# Patient Record
Sex: Female | Born: 1975 | Race: Black or African American | Hispanic: Yes | Marital: Married | Smoking: Current every day smoker
Health system: Southern US, Community
[De-identification: ages and names within clinical notes are randomized; demographics above are authoritative.]

## PROBLEM LIST (undated history)

## (undated) DIAGNOSIS — M1710 Unilateral primary osteoarthritis, unspecified knee: Secondary | ICD-10-CM

## (undated) DIAGNOSIS — Z8489 Family history of other specified conditions: Secondary | ICD-10-CM

## (undated) DIAGNOSIS — S0990XA Unspecified injury of head, initial encounter: Secondary | ICD-10-CM

## (undated) DIAGNOSIS — G4730 Sleep apnea, unspecified: Secondary | ICD-10-CM

## (undated) DIAGNOSIS — I10 Essential (primary) hypertension: Secondary | ICD-10-CM

## (undated) DIAGNOSIS — R011 Cardiac murmur, unspecified: Secondary | ICD-10-CM

## (undated) HISTORY — DX: Unilateral primary osteoarthritis, unspecified knee: M17.10

## (undated) HISTORY — DX: Essential (primary) hypertension: I10

## (undated) HISTORY — DX: Sleep apnea, unspecified: G47.30

## (undated) HISTORY — DX: Cardiac murmur, unspecified: R01.1

---
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
## 1994-10-17 HISTORY — PX: APPENDECTOMY: SHX54

## 2009-10-17 DIAGNOSIS — G4730 Sleep apnea, unspecified: Secondary | ICD-10-CM

## 2009-10-17 HISTORY — DX: Sleep apnea, unspecified: G47.30

## 2009-11-16 ENCOUNTER — Ambulatory Visit (HOSPITAL_BASED_OUTPATIENT_CLINIC_OR_DEPARTMENT_OTHER): Admission: RE | Admit: 2009-11-16 | Discharge: 2009-11-16 | Payer: Self-pay | Admitting: Neurology

## 2009-12-06 ENCOUNTER — Ambulatory Visit: Payer: Self-pay | Admitting: Pulmonary Disease

## 2011-04-08 ENCOUNTER — Ambulatory Visit (INDEPENDENT_AMBULATORY_CARE_PROVIDER_SITE_OTHER): Payer: Medicaid Other

## 2011-04-08 ENCOUNTER — Inpatient Hospital Stay (INDEPENDENT_AMBULATORY_CARE_PROVIDER_SITE_OTHER)
Admission: RE | Admit: 2011-04-08 | Discharge: 2011-04-08 | Disposition: A | Discharge status: Home / Self Care | Payer: Medicaid Other | Source: Ambulatory Visit | Attending: Emergency Medicine | Admitting: Emergency Medicine

## 2011-04-08 DIAGNOSIS — S60219A Contusion of unspecified wrist, initial encounter: Secondary | ICD-10-CM

## 2011-08-12 ENCOUNTER — Inpatient Hospital Stay (INDEPENDENT_AMBULATORY_CARE_PROVIDER_SITE_OTHER)
Admission: RE | Admit: 2011-08-12 | Discharge: 2011-08-12 | Disposition: A | Discharge status: Home / Self Care | Payer: Medicaid Other | Source: Ambulatory Visit | Attending: Family Medicine | Admitting: Family Medicine

## 2011-08-12 ENCOUNTER — Emergency Department (HOSPITAL_COMMUNITY)
Admission: EM | Admit: 2011-08-12 | Discharge: 2011-08-12 | Disposition: A | Discharge status: Home / Self Care | Payer: Medicaid Other | Attending: Emergency Medicine | Admitting: Emergency Medicine

## 2011-08-12 DIAGNOSIS — E669 Obesity, unspecified: Secondary | ICD-10-CM | POA: Insufficient documentation

## 2011-08-12 DIAGNOSIS — R252 Cramp and spasm: Secondary | ICD-10-CM | POA: Insufficient documentation

## 2011-08-12 DIAGNOSIS — M79609 Pain in unspecified limb: Secondary | ICD-10-CM

## 2011-08-12 DIAGNOSIS — A4902 Methicillin resistant Staphylococcus aureus infection, unspecified site: Secondary | ICD-10-CM | POA: Insufficient documentation

## 2011-08-12 DIAGNOSIS — L680 Hirsutism: Secondary | ICD-10-CM | POA: Insufficient documentation

## 2011-08-12 DIAGNOSIS — M7989 Other specified soft tissue disorders: Secondary | ICD-10-CM

## 2011-08-17 ENCOUNTER — Inpatient Hospital Stay (INDEPENDENT_AMBULATORY_CARE_PROVIDER_SITE_OTHER)
Admission: RE | Admit: 2011-08-17 | Discharge: 2011-08-17 | Disposition: A | Discharge status: Home / Self Care | Payer: Medicaid Other | Source: Ambulatory Visit | Attending: Family Medicine | Admitting: Family Medicine

## 2011-08-17 DIAGNOSIS — I10 Essential (primary) hypertension: Secondary | ICD-10-CM

## 2011-08-17 DIAGNOSIS — B957 Other staphylococcus as the cause of diseases classified elsewhere: Secondary | ICD-10-CM

## 2011-08-17 DIAGNOSIS — Z1611 Resistance to penicillins: Secondary | ICD-10-CM

## 2011-10-14 ENCOUNTER — Ambulatory Visit: Payer: Medicaid Other | Admitting: Emergency Medicine

## 2011-10-27 ENCOUNTER — Encounter: Payer: Self-pay | Admitting: Emergency Medicine

## 2011-10-27 ENCOUNTER — Ambulatory Visit (INDEPENDENT_AMBULATORY_CARE_PROVIDER_SITE_OTHER): Payer: Medicaid Other | Admitting: Emergency Medicine

## 2011-10-27 VITALS — BP 180/110 | HR 91 | Ht 62.0 in | Wt 348.0 lb

## 2011-10-27 DIAGNOSIS — J4550 Severe persistent asthma, uncomplicated: Secondary | ICD-10-CM | POA: Insufficient documentation

## 2011-10-27 DIAGNOSIS — G4733 Obstructive sleep apnea (adult) (pediatric): Secondary | ICD-10-CM | POA: Insufficient documentation

## 2011-10-27 DIAGNOSIS — G4730 Sleep apnea, unspecified: Secondary | ICD-10-CM

## 2011-10-27 DIAGNOSIS — F649 Gender identity disorder, unspecified: Secondary | ICD-10-CM | POA: Insufficient documentation

## 2011-10-27 DIAGNOSIS — J45909 Unspecified asthma, uncomplicated: Secondary | ICD-10-CM

## 2011-10-27 DIAGNOSIS — Z720 Tobacco use: Secondary | ICD-10-CM

## 2011-10-27 DIAGNOSIS — K625 Hemorrhage of anus and rectum: Secondary | ICD-10-CM | POA: Insufficient documentation

## 2011-10-27 DIAGNOSIS — N926 Irregular menstruation, unspecified: Secondary | ICD-10-CM | POA: Insufficient documentation

## 2011-10-27 DIAGNOSIS — F642 Gender identity disorder of childhood: Secondary | ICD-10-CM

## 2011-10-27 DIAGNOSIS — I10 Essential (primary) hypertension: Secondary | ICD-10-CM | POA: Insufficient documentation

## 2011-10-27 DIAGNOSIS — Z23 Encounter for immunization: Secondary | ICD-10-CM

## 2011-10-27 DIAGNOSIS — F17200 Nicotine dependence, unspecified, uncomplicated: Secondary | ICD-10-CM

## 2011-10-27 LAB — CBC
Hemoglobin: 13.5 g/dL (ref 12.0–15.0)
MCH: 28.1 pg (ref 26.0–34.0)
MCV: 85.8 fL (ref 78.0–100.0)
RBC: 4.8 MIL/uL (ref 3.87–5.11)

## 2011-10-27 LAB — COMPREHENSIVE METABOLIC PANEL
CO2: 27 mEq/L (ref 19–32)
Calcium: 9.6 mg/dL (ref 8.4–10.5)
Glucose, Bld: 80 mg/dL (ref 70–99)
Sodium: 140 mEq/L (ref 135–145)
Total Bilirubin: 0.4 mg/dL (ref 0.3–1.2)
Total Protein: 7.6 g/dL (ref 6.0–8.3)

## 2011-10-27 LAB — TSH: TSH: 2.959 u[IU]/mL (ref 0.350–4.500)

## 2011-10-27 MED ORDER — ALBUTEROL SULFATE (2.5 MG/3ML) 0.083% IN NEBU: 2.5000 mg | INHALATION_SOLUTION | Freq: Four times a day (QID) | RESPIRATORY_TRACT | Status: DC | PRN

## 2011-10-27 MED ORDER — ALBUTEROL SULFATE HFA 108 (90 BASE) MCG/ACT IN AERS: 2.0000 | INHALATION_SPRAY | Freq: Four times a day (QID) | RESPIRATORY_TRACT | Status: DC | PRN

## 2011-10-27 MED ORDER — LISINOPRIL-HYDROCHLOROTHIAZIDE 10-12.5 MG PO TABS: 1.0000 | ORAL_TABLET | Freq: Every day | ORAL | Status: DC

## 2011-10-27 NOTE — Progress Notes (Signed)
  Subjective:    Patient ID: Rachael Rogers, female    DOB: April 28, 1976, 36 y.o.   MRN: [id number]  HPI Rachael Rogers is here today to establish care.  She has multiple concerns, including headache, asthma, smoking cessation, alcohol use, possible PCOS, gender identity, rectal bleeding, and sleep apnea.  She was also noted to have a blood pressure of 180/110.  After discussion with patient, we agreed to address high blood pressure, possible PCOS and asthma medications today.  She will return in a few weeks to discuss other concerns.  1. High blood pressure: Patient reports that it was also elevated in October when she was seen in Urgent Care for a MRSA skin infection.  Does complain of headache behind the eyes that responds poorly to OTC medications.  Denies leg edema, chest pain, palpitations, new/different dyspnea.  Has never been treated in the past.  2. Irregular Menses: Menarche at age 52; irregular since then (though they were more regular when she was down to 300lbs).  Has had facial hair since age 38 and started gaining weight around age 2 as well.  No history of STDs or vaginal discharge.  Has never had a pap smear.  Biggest concern is that having PCOS will interfere with started testosterone in the future for gender identity.  3. Asthma: Patient states on disability for her asthma.  Takes nebulized albuterol 3x/day as well as an albuterol inhaler.  I have reviewed and updated the following as appropriate: allergies, current medications, past family history, past medical history, past social history, past surgical history and problem list   Review of Systems See HPI.    Objective:   Physical Exam Vitals: reviewed, notable for BP of 180/110 General: obese, pleasant, cooperative HEENT: AT/[place], facial hair on chin and upper lip, MMM, sclera white, EOMI CV: distant heart sounds due to body habitus; RRR, no murmurs Pulm: CTAB, no wheezes, rales Abd: obese, soft, non-tender Ext: no peripheral  edema Skin: no rashes Neuro: gait normal      Assessment & Plan:

## 2011-10-27 NOTE — Assessment & Plan Note (Signed)
Likely secondary to PCOS given facial hair, irregular periods and obesity.  Will do a pap smear at a future visit and discuss a pelvic ultrasound then as well.

## 2011-10-27 NOTE — Assessment & Plan Note (Signed)
Pt reports severe asthma for which she has disability.  Will refill her albuterol inhaler and albuterol nebs.  Will need to discuss further as she does not report being on an inhaled steroid.

## 2011-10-27 NOTE — Patient Instructions (Addendum)
It was nice to meet you!  We have a lot of things to work on.  We are going to get some labs this afternoon - I will call you if something is wrong.  Your blood pressure is very high.  I have sent a prescription for a combination medication to the pharmacy.  Please take this once a day!!  I have sent asthma medicine to your pharmacy ([facility] on [address])  You are going to bring your sleep study results with you to the next appointment so we can start the process of getting you CPAP.  I will see you back in 2-3 weeks to follow up blood pressure and address some of your other concerns.

## 2011-10-27 NOTE — Assessment & Plan Note (Signed)
Patient to bring results of her sleep study to next appointment so we can refer for CPAP.

## 2011-10-27 NOTE — Assessment & Plan Note (Signed)
Blood pressure significantly elevated today.  Pt reports elevated blood pressures in October as well.  Likely multifactorial with history of sleep apnea (not on CPAP) and obesity.  Will check Cmet, CBC, TSH and start HCTZ/lisinopril 12.5/10 daily.  Will call patient and change medication if abnormalities appear on labs.  Will follow up in 2-3 weeks.

## 2011-10-27 NOTE — Progress Notes (Signed)
Addended by: Phebe Colla on: 10/27/2011 05:37 PM   Modules accepted: Orders

## 2011-10-31 ENCOUNTER — Encounter: Payer: Self-pay | Admitting: Emergency Medicine

## 2011-11-17 ENCOUNTER — Ambulatory Visit: Payer: Medicaid Other | Admitting: Emergency Medicine

## 2011-12-16 ENCOUNTER — Ambulatory Visit: Payer: Medicaid Other | Admitting: Emergency Medicine

## 2011-12-27 ENCOUNTER — Ambulatory Visit (INDEPENDENT_AMBULATORY_CARE_PROVIDER_SITE_OTHER): Payer: Medicaid Other | Admitting: Emergency Medicine

## 2011-12-27 ENCOUNTER — Encounter: Payer: Self-pay | Admitting: Emergency Medicine

## 2011-12-27 VITALS — BP 150/95 | HR 107 | Ht 62.0 in | Wt 344.0 lb

## 2011-12-27 DIAGNOSIS — Z720 Tobacco use: Secondary | ICD-10-CM

## 2011-12-27 DIAGNOSIS — Z79899 Other long term (current) drug therapy: Secondary | ICD-10-CM | POA: Insufficient documentation

## 2011-12-27 DIAGNOSIS — J45909 Unspecified asthma, uncomplicated: Secondary | ICD-10-CM

## 2011-12-27 DIAGNOSIS — K0889 Other specified disorders of teeth and supporting structures: Secondary | ICD-10-CM | POA: Insufficient documentation

## 2011-12-27 DIAGNOSIS — I10 Essential (primary) hypertension: Secondary | ICD-10-CM

## 2011-12-27 DIAGNOSIS — K089 Disorder of teeth and supporting structures, unspecified: Secondary | ICD-10-CM

## 2011-12-27 DIAGNOSIS — F17200 Nicotine dependence, unspecified, uncomplicated: Secondary | ICD-10-CM

## 2011-12-27 DIAGNOSIS — G4733 Obstructive sleep apnea (adult) (pediatric): Secondary | ICD-10-CM

## 2011-12-27 DIAGNOSIS — Z5189 Encounter for other specified aftercare: Secondary | ICD-10-CM

## 2011-12-27 MED ORDER — KETOROLAC TROMETHAMINE 60 MG/2ML IM SOLN
60.0000 mg | Freq: Once | INTRAMUSCULAR | Status: AC
  Administered 2011-12-27: 60 mg via INTRAMUSCULAR

## 2011-12-27 MED ORDER — OXYCODONE-ACETAMINOPHEN 5-325 MG PO TABS: 1.0000 | ORAL_TABLET | ORAL | Status: AC | PRN

## 2011-12-27 MED ORDER — PENICILLIN V POTASSIUM 500 MG PO TABS: 500.0000 mg | ORAL_TABLET | Freq: Four times a day (QID) | ORAL | Status: AC

## 2011-12-27 MED ORDER — BECLOMETHASONE DIPROPIONATE 80 MCG/ACT IN AERS: 2.0000 | INHALATION_SPRAY | Freq: Two times a day (BID) | RESPIRATORY_TRACT | Status: AC

## 2011-12-27 NOTE — Assessment & Plan Note (Signed)
Concern for infection on exam.  Will give PCN VK x10 days and Percocet 5-325 q4hr prn. Patient to continue ibuprofen as well. Patient provided with list of Medicaid dentists and is to see them for definitive treatment.  Will give toradol 60mg  IM in clinic for pain control.

## 2011-12-27 NOTE — Patient Instructions (Addendum)
For your tooth - I have given you a prescription for penicillin and Percocet.  The percocet may make you feel a little loopy, so take it when you have some time at home the first time.  Please continue taking ibuprofen to help with inflammation.  For your blood pressure - keep taking you medicine.  Please check your blood pressure at the drug store after you have been taking the medication for 1-2 weeks.  I have also put in an order for a sleep study for your sleep apnea.  For your asthma - please start taking QVAR 2 puffs twice a day.  Also, please schedule an appointment with Dr. Raymondo Band for spirometry and smoking cessation (you should be able to do this at the front desk on the way out).   Please see me back in 1 month to continue working on your blood pressure and other concerns.

## 2011-12-27 NOTE — Assessment & Plan Note (Signed)
Patient is down to 12 cig/day.  Will refer to Dr. Raymondo Band for further smoking cessation counseling.

## 2011-12-27 NOTE — Assessment & Plan Note (Signed)
Patient with difficulty obtaining medications, both due to cost and transportation issues.

## 2011-12-27 NOTE — Assessment & Plan Note (Signed)
Not controlled, but better.  Patient with difficulty pay for and obtaining medication.  Has missed several doses this week.  Will continue current medication and have her check BP at pharmacy after taking medicine consistently for 1-2 weeks and call with result.  Will also order split sleep study for sleep apnea.

## 2011-12-27 NOTE — Progress Notes (Signed)
  Subjective:    Patient ID: Rachael Rogers, female    DOB: [birth year], 36 y.o.   MRN: [id number]  HPI Ms. Hilgeman is here today for several issues.  1. Toothache: This has been going on for 3 days and is making it difficult for her to sleep.  Located in left lower jaw.  No fevers or facial swelling.  Associated with headache.  Headache relieved by excedrin, but no help with tooth pain.  Has also tried motrin 800mg , salt rinse, listerine rinse, and peroxide rinse with no relief.  2. HTN: Patient had some difficulty getting the medication.  Did get the medicine several weeks ago, but missed several doses this week.  Tolerating the medicine well.  No vision changes, headaches, chest pain, worsening of shortness of breath, dizziness.  Does has some leg edema.  3. Asthma: Diagnosed as a young child as severe persistent.  Currently taking albuterol (MDI and nebs) only, 4-6 times per day.  Has a daily nighttime cough.  Does have episodes of audible wheezing every few weeks, that she deals with at home.  Has been on inhaled steroids before, but these were stopped due to weight gain.  Is a current smoker - working on quitting, down to 12 cigs/day from 1.5ppd.   Review of Systems See HPI, otherwise negative    Objective:   Physical Exam BP 150/95  Pulse 107  Ht 5\' 2"  (1.575 m)  Wt 344 lb (156.037 kg)  BMI 62.92 kg/m2 Gen: morbid obesity, NAD, cooperative HEENT: AT/[place], sclera white, no pharyngeal erythema or exudate, multiple fillings, pain with palpation of all lower left molars, white plaque with mild surrounding erythema on left lower posterior gum that is exquisitely tender, no facial swelling Neck: no LAD CV: RRR, no murmurs or gallops Pulm: CTAB, no wheezes or rales Abd: obese Ext: 2+ pitting edema to just below knees bilaterally; WWP Skin: normal Neuro: no obvious focal deficits; gait normal      Assessment & Plan:

## 2011-12-27 NOTE — Assessment & Plan Note (Signed)
Based on history is likely severe persistent.  Has been on inhaled steroids in the past, but stopped them due to weight gain.  Will start QVAR 2 puffs BID.  Will also obtain spirometry with and without bronchodilator to evaluate for any non-reversible component.

## 2012-01-23 ENCOUNTER — Ambulatory Visit (HOSPITAL_BASED_OUTPATIENT_CLINIC_OR_DEPARTMENT_OTHER): Payer: Medicaid Other

## 2012-01-23 ENCOUNTER — Ambulatory Visit: Payer: Medicaid Other | Admitting: Pharmacist

## 2012-01-25 ENCOUNTER — Ambulatory Visit: Payer: Medicaid Other | Admitting: Emergency Medicine

## 2012-02-07 ENCOUNTER — Ambulatory Visit (INDEPENDENT_AMBULATORY_CARE_PROVIDER_SITE_OTHER): Payer: Medicaid Other | Admitting: Emergency Medicine

## 2012-02-07 ENCOUNTER — Encounter: Payer: Self-pay | Admitting: Pharmacist

## 2012-02-07 ENCOUNTER — Ambulatory Visit (INDEPENDENT_AMBULATORY_CARE_PROVIDER_SITE_OTHER): Payer: Medicaid Other | Admitting: Pharmacist

## 2012-02-07 ENCOUNTER — Encounter: Payer: Self-pay | Admitting: Emergency Medicine

## 2012-02-07 VITALS — BP 140/99 | HR 100 | Ht 59.0 in | Wt 343.0 lb

## 2012-02-07 DIAGNOSIS — Z720 Tobacco use: Secondary | ICD-10-CM

## 2012-02-07 DIAGNOSIS — R51 Headache: Secondary | ICD-10-CM

## 2012-02-07 DIAGNOSIS — J4550 Severe persistent asthma, uncomplicated: Secondary | ICD-10-CM

## 2012-02-07 DIAGNOSIS — F17200 Nicotine dependence, unspecified, uncomplicated: Secondary | ICD-10-CM

## 2012-02-07 DIAGNOSIS — G43909 Migraine, unspecified, not intractable, without status migrainosus: Secondary | ICD-10-CM | POA: Insufficient documentation

## 2012-02-07 DIAGNOSIS — I10 Essential (primary) hypertension: Secondary | ICD-10-CM

## 2012-02-07 DIAGNOSIS — G4730 Sleep apnea, unspecified: Secondary | ICD-10-CM

## 2012-02-07 DIAGNOSIS — J45909 Unspecified asthma, uncomplicated: Secondary | ICD-10-CM

## 2012-02-07 MED ORDER — LISINOPRIL-HYDROCHLOROTHIAZIDE 10-12.5 MG PO TABS: 1.0000 | ORAL_TABLET | Freq: Every day | ORAL | Status: DC

## 2012-02-07 NOTE — Assessment & Plan Note (Signed)
Received sleep study from 2011 showing moderate sleep apnea.  Patient would prefer to get repeat sleep study and CPAP through neurology (seen Dr. Jacques Navy in past).  If she is unable to get an appointment with neurology, will do sleep study through [facility].

## 2012-02-07 NOTE — Assessment & Plan Note (Addendum)
Likely migraine without aura.  Discussed starting propranolol to decrease frequency and severity.  Discussed common side effects.  Patient would like to hold off for now until she sees the neurologist. Will not give a triptan at this time as I am concerned she will take it daily and increase risk of rebound headaches.

## 2012-02-07 NOTE — Patient Instructions (Signed)
It was nice to see you again! I'm glad you saw Dr. Raymondo Band and it looks like you have a good plan to quit smoking! For your headaches (they sound like migraines), I would consider starting Propranolol to decrease the frequency and severity of the headaches.  The most common side effects of this medicine are lower blood pressure and feeling fatigue. Let me know if there is anything I can do to help you re-establish with your neurologist, Dr. Jacques Navy.   I will see you back in 1-2 months to do a pap smear.

## 2012-02-07 NOTE — Assessment & Plan Note (Signed)
Patient is a 81 YOF with mod-severe nicotine dependence of 25 years duration, morbid obesity, OSA, asthma, and HTN. Patient has multiple risk factors for CAD. Modifiable risk factors include smoking, obesity, and HTN.   1. Smoking - Patient is a fair candidate for success b/c of previously successful quit periods "cold [place]." Quit date will be Mother's Day. Until quit date, patient will aim to cut down to 5 cigarettes per day. If patient quits for 1 week, she and her family will going out to watch a movie as a reward. F/u tobacco cessation counseling appt on 03/13/12 after Mother's Day.  2. Spirometry Test Results/Asthma/OSA - Near normal spirometry findings, though her lung age is 36yo and patient's results confounded by use of albuterol today. Patient's goals to lose weight and to begin using QVAR again may help significantly with her asthma and OSA symptoms. QVAR Rx refilled.  F/U Rx Clinic Visit 03/13/12.  Total time in face-to-face counseling 50 minutes.  Patient seen with Tana Conch, PharmD, Pharmacy Resident.

## 2012-02-07 NOTE — Patient Instructions (Signed)
Congratulations on your commitment to quitting smoking! 1. From now until your quit date on Mother's Day, our plan is for you to cut down to 5 cigarettes per day. If you quit for 1 week, your family gets a reward of going out to watch a movie! 2. Purchase QVAR. 3. Goal weight loss: 1-2 lbs per week. Will cut back on beer and soda.

## 2012-02-07 NOTE — Assessment & Plan Note (Signed)
Much better on the lisinopril/HCTZ.  Continue current medications.

## 2012-02-07 NOTE — Assessment & Plan Note (Signed)
Seen by Dr. Raymondo Band today, appreciate his assistance in creating a quit plan.

## 2012-02-07 NOTE — Assessment & Plan Note (Signed)
Patient is a 79 YOF with mod-severe nicotine dependence of 25 years duration, morbid obesity, OSA, asthma, and HTN. Patient has multiple risk factors for CAD. Modifiable risk factors include smoking, obesity, and HTN.   1. Smoking - Patient is a fair candidate for success b/c of previously successful quit periods "cold [place]." Quit date will be Mother's Day. Until quit date, patient will aim to cut down to 5 cigarettes per day. If patient quits for 1 week, she and her family will going out to watch a movie as a reward. F/u tobacco cessation counseling appt on 03/13/12 after Mother's Day.  F/U Rx Clinic Visit 03/13/12.  Total time in face-to-face counseling 50 minutes.  Patient seen with Tana Conch, PharmD, Pharmacy Resident.

## 2012-02-07 NOTE — Progress Notes (Signed)
  Subjective:    Patient ID: Rachael Rogers, female    DOB: 09-24-1976, 36 y.o.   MRN: [id number]  HPI Rachael Rogers is here for follow up of blood pressure and headaches.  Hypertension Well controlled: yes Compliant with medication: yes Side effects from medication: no Check BP at home: no  Chest pain: no Palpitations: no Leg edema: no Dizziness: no  Headaches: Reports daily headaches. Typically located frontally and in the eyes.  Throbbing. + photophobia, phonophobia, nausea.  Lasts hours to days.  Motrin does not really help much.  The percocet for her tooth from last visit did not help with the headaches.  Does have a headache today, but located in neck and she attributes to her pillow.  Has seen Dr. Jacques Navy in Neurology in the past and is trying to get an appointment with her again.  Was told she had scarring in her brain on MRI.  I have reviewed and updated the following as appropriate: allergies and current medications SHx: working with Dr. Raymondo Band to quit smoking  Review of Systems See HPI    Objective:   Physical Exam BP 140/99  Pulse 100  Ht 4\' 11"  (1.499 m)  Wt 343 lb (155.584 kg)  BMI 69.28 kg/m2  LMP 11/09/2011 Gen: morbidly obese, alert, NAD HEENT: AT/[place], sclera white, PERRL, no obvious papilledema on fundoscopic exam.  Mild tenderness to palpation over left maxillary sinus. Neck: supple, no CAD, knot in left superior trapezius  CV: RRR, no murmurs Pulm: CTAB, no wheezes     Assessment & Plan:

## 2012-02-07 NOTE — Progress Notes (Signed)
  Subjective:    Patient ID: Rachael Rogers, female    DOB: [birth year], 36 y.o.   MRN: [id number]  HPI Age when started using tobacco on a daily basis 36 yo. Number of Cigarettes per day 10-20. Brand smoked Maverick Menthols. Estimated Nicotine Content per Cigarette (mg) 1mg .  Estimated Nicotine intake per day 10-20 mg.   Smokes first cigarette immediately after waking. Denies waking to smoke / Smokes times per night.    Estimated Fagerstrom Score >5/10.  Has been able to quit cold [place] for >6 months multiple times.  Has not used Medications (NRT, bupropion, varenicline) in past.  Rates IMPORTANCE of quitting tobacco on 1-10 scale of 10. Rates READINESS of quitting tobacco on 1-10 scale of 15. Rates CONFIDENCE of quitting tobacco on 1-10 scale of 10. Triggers to use tobacco include; waking up, emotional stress (caring for kids and puppy), tragedy (death of friend)  Review of Systems     Objective:   Physical Exam  See Documentation Flowsheet (discrete results - PFTs) for complete Spirometry results. Patient provided good effort while attempting spirometry.   Lung Age = 36 yo     Assessment & Plan:  Patient is a 36 YOF with mod-severe nicotine dependence of 25 years duration, morbid obesity, OSA, asthma, and HTN. Patient has multiple risk factors for CAD. Modifiable risk factors include smoking, obesity, and HTN.   1. Smoking - Patient is a fair candidate for success b/c of previously successful quit periods "cold [place]." Quit date will be Mother's Day. Until quit date, patient will aim to cut down to 5 cigarettes per day. If patient quits for 1 week, she and her family will going out to watch a movie as a reward. F/u tobacco cessation counseling appt on 03/13/12 after Mother's Day.  2. Obesity/HTN - Goal weight loss 1-2 lbs per week. Discussed cutting back on beer and soda. Discussed exercise plans. Will need to discuss salt intake at next visit.  3. Spirometry Test  Results/Asthma/OSA - Near normal spirometry findings, though her lung age is 36yo and patient's results confounded by use of albuterol today. Patient's goals to lose weight and to begin using QVAR again may help significantly with her asthma and OSA symptoms. QVAR Rx refilled.  F/U Rx Clinic Visit 03/13/12.  Total time in face-to-face counseling 50 minutes.  Patient seen with Tana Conch, PharmD, Pharmacy Resident.

## 2012-02-08 NOTE — Progress Notes (Signed)
  Subjective:    Patient ID: Rachael Rogers, female    DOB: Apr 06, 1976, 36 y.o.   MRN: [id number]  HPI Reviewed and agree with Dr. Macky Lower management.      Review of Systems     Objective:   Physical Exam        Assessment & Plan:

## 2012-02-12 ENCOUNTER — Ambulatory Visit (HOSPITAL_BASED_OUTPATIENT_CLINIC_OR_DEPARTMENT_OTHER): Payer: Medicaid Other

## 2012-03-13 ENCOUNTER — Ambulatory Visit: Payer: Medicaid Other | Admitting: Pharmacist

## 2012-03-20 ENCOUNTER — Ambulatory Visit (INDEPENDENT_AMBULATORY_CARE_PROVIDER_SITE_OTHER): Payer: Medicaid Other | Admitting: Pharmacist

## 2012-03-20 ENCOUNTER — Encounter: Payer: Self-pay | Admitting: Pharmacist

## 2012-03-20 VITALS — BP 138/102 | HR 60 | Wt 343.0 lb

## 2012-03-20 DIAGNOSIS — Z720 Tobacco use: Secondary | ICD-10-CM

## 2012-03-20 DIAGNOSIS — F17200 Nicotine dependence, unspecified, uncomplicated: Secondary | ICD-10-CM

## 2012-03-20 NOTE — Patient Instructions (Signed)
Cut down to 3 cigarettes a day in the next week  Try to quit around travel back to [place] in July Return to pharmacy clinic in early August

## 2012-03-20 NOTE — Assessment & Plan Note (Signed)
moderate Nicotine Dependence of several years duration in a patient who is good candidate for success b/c of current success with cutting down from 21 to 7 cigs per day and current level of motivation to be a good role model for her child AND desire to save money. She plans to cut down to 3 cigs per day within the next week.  Quit date will be before her vacation to [place] in later June or early July.  Next visit will be with Dr. Elwyn Reach in the next few weeks. F/U Rx Clinic Visit in August.   Total time in face-to-face counseling 20 minutes.  Patient seen with Brett Fairy, PharmD, Pharmacy Resident

## 2012-03-20 NOTE — Progress Notes (Signed)
Patient ID: Rachael Rogers, female   DOB: [birth year], 36 y.o.   MRN: [id number] Reviewed and agree with Dr. Macky Lower management.

## 2012-03-20 NOTE — Progress Notes (Signed)
  Subjective:    Patient ID: Rachael Rogers, female    DOB: Aug 12, 1976, 36 y.o.   MRN: [id number]  HPI  Patient arrives in good spirits and reports improved breathing.  She has decreased her albuterol use to once or twice daily.  She reports taking her QVAR and also reports decreasing # cigs to 7 per day.  She is willing to consider a quit date by the end of the month.   She is anticipating a visit to [facility] for the month of July and states she needs to be quit by that vacation.   She has multiple strategies to keep herself busy and currently states her main triggers for smoking are after meals AND first thing in the morning while using the bathroom.     Review of Systems     Objective:   Physical Exam        Assessment & Plan:  moderate Nicotine Dependence of several years duration in a patient who is good candidate for success b/c of current success with cutting down from 21 to 7 cigs per day and current level of motivation to be a good role model for her child AND desire to save money. She plans to cut down to 3 cigs per day within the next week.  Quit date will be before her vacation to [place] in later June or early July.  Next visit will be with Dr. Elwyn Reach in the next few weeks. F/U Rx Clinic Visit in August.   Total time in face-to-face counseling 20 minutes.  Patient seen with Brett Fairy, PharmD, Pharmacy Resident. Marland Kitchen

## 2012-11-28 ENCOUNTER — Encounter: Payer: Medicaid Other | Admitting: Emergency Medicine

## 2012-12-17 ENCOUNTER — Encounter: Payer: Medicaid Other | Admitting: Emergency Medicine

## 2012-12-27 ENCOUNTER — Encounter: Payer: Medicaid Other | Admitting: Emergency Medicine

## 2013-01-01 ENCOUNTER — Telehealth: Payer: Self-pay | Admitting: *Deleted

## 2013-01-01 NOTE — Telephone Encounter (Signed)
Patient calls today to reschedule her sleep study appointment, her ride is not able to bring her due to inclement weather.  She would like to wait until the weather clears and then call us to reschedule her appointment. -S. Lizann Edelman, RPSGT

## 2013-04-24 ENCOUNTER — Ambulatory Visit (INDEPENDENT_AMBULATORY_CARE_PROVIDER_SITE_OTHER): Payer: Medicaid Other | Admitting: Emergency Medicine

## 2013-04-24 ENCOUNTER — Encounter: Payer: Self-pay | Admitting: Emergency Medicine

## 2013-04-24 VITALS — BP 162/99 | HR 84 | Temp 97.9°F | Resp 16 | Ht 62.0 in | Wt 342.5 lb

## 2013-04-24 DIAGNOSIS — Z720 Tobacco use: Secondary | ICD-10-CM

## 2013-04-24 DIAGNOSIS — I10 Essential (primary) hypertension: Secondary | ICD-10-CM

## 2013-04-24 DIAGNOSIS — R229 Localized swelling, mass and lump, unspecified: Secondary | ICD-10-CM

## 2013-04-24 DIAGNOSIS — J4550 Severe persistent asthma, uncomplicated: Secondary | ICD-10-CM

## 2013-04-24 DIAGNOSIS — F649 Gender identity disorder, unspecified: Secondary | ICD-10-CM

## 2013-04-24 DIAGNOSIS — J45909 Unspecified asthma, uncomplicated: Secondary | ICD-10-CM

## 2013-04-24 DIAGNOSIS — M25561 Pain in right knee: Secondary | ICD-10-CM | POA: Insufficient documentation

## 2013-04-24 DIAGNOSIS — F17200 Nicotine dependence, unspecified, uncomplicated: Secondary | ICD-10-CM

## 2013-04-24 DIAGNOSIS — M67432 Ganglion, left wrist: Secondary | ICD-10-CM | POA: Insufficient documentation

## 2013-04-24 DIAGNOSIS — F642 Gender identity disorder of childhood: Secondary | ICD-10-CM

## 2013-04-24 DIAGNOSIS — M25569 Pain in unspecified knee: Secondary | ICD-10-CM

## 2013-04-24 LAB — LIPID PANEL
Cholesterol: 182 mg/dL (ref 0–200)
HDL: 38 mg/dL — ABNORMAL LOW (ref >39)
LDL Cholesterol: 125 mg/dL — ABNORMAL HIGH (ref 0–99)
Triglycerides: 93 mg/dL (ref <150)

## 2013-04-24 LAB — BASIC METABOLIC PANEL
CO2: 30 mEq/L (ref 19–32)
Calcium: 9.3 mg/dL (ref 8.4–10.5)
Sodium: 139 mEq/L (ref 135–145)

## 2013-04-24 LAB — POCT GLYCOSYLATED HEMOGLOBIN (HGB A1C): Hemoglobin A1C: 5.6

## 2013-04-24 MED ORDER — ALBUTEROL SULFATE (2.5 MG/3ML) 0.083% IN NEBU: 2.5000 mg | INHALATION_SOLUTION | Freq: Four times a day (QID) | RESPIRATORY_TRACT | Status: DC | PRN

## 2013-04-24 MED ORDER — ALBUTEROL SULFATE HFA 108 (90 BASE) MCG/ACT IN AERS: 2.0000 | INHALATION_SPRAY | Freq: Four times a day (QID) | RESPIRATORY_TRACT | Status: DC | PRN

## 2013-04-24 MED ORDER — LISINOPRIL-HYDROCHLOROTHIAZIDE 10-12.5 MG PO TABS: 1.0000 | ORAL_TABLET | Freq: Every day | ORAL | Status: DC

## 2013-04-24 MED ORDER — MELOXICAM 15 MG PO TABS: 15.0000 mg | ORAL_TABLET | Freq: Every day | ORAL | Status: DC

## 2013-04-24 NOTE — Assessment & Plan Note (Signed)
Interested in quitting.  Does not want to try Chantix. Working with Dr. Raymondo Band has helped in the past. Encouraged her to see Dr. Raymondo Band again.

## 2013-04-24 NOTE — Assessment & Plan Note (Signed)
Elevated today, but has not taken any medication for almost 1 year. Restart lisinopril-HCTZ combo. Check BMP, A1c, and lipids today. Follow up in 2 weeks for recheck.

## 2013-04-24 NOTE — Assessment & Plan Note (Signed)
Located on left dorsal wrist.  Does not feel cystic.  Does not move with wrist or finger movement. Will refer to Sports Medicine for ultrasound of the lesion.

## 2013-04-24 NOTE — Patient Instructions (Addendum)
It was nice to see you! Please restart the blood pressure medication. For the wrist - you should get a call from Sports Medicine to schedule an appointment. For the knees - please get the x-rays - you can go to radiology at [facility] at any time. - take extra strength tylenol 2 tablets 3 times a day. - take meloxicam 1 tablet daily - DO NOT take ibuprofen, aleve or advil with this medicine. - ice your knees for 10 minutes 3 times a day, especially after activity Please make an appointment with Dr. Raymondo Band for smoking cessation. Follow up in 2 weeks.

## 2013-04-24 NOTE — Assessment & Plan Note (Signed)
I suspect OA given her weight and exam. Will check standing films. Scheduled tylenol 1g TID. Meloxicam 15mg  daily. Recommended icing the knees. Follow up in 2 weeks.  Consider steroid injection at that time.

## 2013-04-24 NOTE — Progress Notes (Signed)
Patient states here for bilateral knee pain Pain to her left wrist with a small "knot" to the top of the wrist

## 2013-04-24 NOTE — Assessment & Plan Note (Signed)
Will need to address starting a controller medication at some point.  For right now, I will refill albuterol.

## 2013-04-24 NOTE — Progress Notes (Signed)
  Subjective:    Patient ID: Rachael Rogers, female    DOB: Jul 13, 1976, 37 y.o.   MRN: [id number]  HPI CATHERENE KALETA is here for knee pain and annual exam.  I have reviewed and updated the following as appropriate: allergies, current medications and problem list SHx: current smoker - interested in quitting -HTN: has been out of medication for almost 1 year -Asthma: uses albuterol only; needs prescription for nebulizer machine  Bilateral knee pain Started with the right knee after the dog ran into it about 4-5 months ago and spread to the left knee.  She has noted some swelling, mostly in the right knee.  Pain is all over, but mostly anterior at inferior patella and lateral.  The pain has been getting progressively worse.  Pain is worse with walking, but also present at rest. No locking or clicking.  Has a family history of "arthritis."  Wrist nodule She reports having a wrist nodule for the last year or so.  This popped up after she injured her wrist moving some heavy furniture.  It doesn't really cause much pain unless she presses on it.  Denies any weakness in the wrist or hand.  Does sometimes get some tingling on the ring and pinkie fingers.  Gender Identity disorder Would like referral to a doctor for testosterone injections.  Review of Systems Patient Information Form: Screening and ROS  Do you feel safe in relationships? yes PHQ-2:negative  Review of Symptoms  General:  Negative for nexplained weight loss, fever Skin: Negative for new or changing mole, sore that won't heal HEENT: Negative for trouble hearing, trouble seeing, ringing in ears, mouth sores, hoarseness, change in voice, dysphagia. CV:  Negative for chest pain, dyspnea, edema, palpitations Resp: Negative for cough, dyspnea, hemoptysis GI: Negative for nausea, vomiting, +diarrhea, constipation, abdominal pain, melena, hematochezia. GU: Negative for dysuria, incontinence, urinary hesitance, hematuria, vaginal or  penile discharge, polyuria, sexual difficulty, lumps in testicle or breasts MSK: Negative for muscle cramps or aches, + joint pain or swelling Neuro: Negative for headaches, weakness, numbness, dizziness, passing out/fainting Psych: Negative for depression, anxiety, memory problems, + stress      Objective:   Physical Exam BP 162/99  Pulse 84  Temp(Src) 97.9 F (36.6 C)  Resp 16  Ht 5\' 2"  (1.575 m)  Wt 342 lb 8 oz (155.357 kg)  BMI 62.63 kg/m2  SpO2 95% Gen: alert, cooperative, NAD, morbidly obese HEENT: AT/[place], sclera white, MMM, facial hair present Neck: supple CV: RRR, no murmurs Pulm: CTAB, no wheezes or rales Right knee: moderate effusion, no erythema or warmth, tender to palpation along superior patellar tendon, lateral and medial joint lines, full AROM Left knee: minimal effusion, no erythema or warmth, tender to palpation along superior patellar tendon, lateral and medial joint lines, full AROM Left wrist: 3mm hard mobile nodule on on dorsal wrist just radial to ulnar styloid; mildly tender to palpation; palpation reproduces some numbness in ulnar hand      Assessment & Plan:

## 2013-04-24 NOTE — Assessment & Plan Note (Signed)
Would like referral to doctor for testosterone shots. She will contact me with the doctors information.

## 2013-04-26 ENCOUNTER — Encounter: Payer: Self-pay | Admitting: Emergency Medicine

## 2013-04-29 ENCOUNTER — Telehealth: Payer: Self-pay | Admitting: Emergency Medicine

## 2013-04-29 DIAGNOSIS — F649 Gender identity disorder, unspecified: Secondary | ICD-10-CM

## 2013-04-29 NOTE — Telephone Encounter (Signed)
Pt is requesting referral Dr. Ruby Cola  In [place] who is an Actor. JW

## 2013-04-30 NOTE — Telephone Encounter (Signed)
Discussed this with the patient at her last appointment.  Will place referral to Dr. 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

## 2013-04-30 NOTE — Telephone Encounter (Signed)
I will call the patient tomorrow to discuss the referral further.  I have canceled the referral for now.

## 2013-04-30 NOTE — Telephone Encounter (Signed)
Per Dr. Mariella Saa office, patient must be followed by a therapist that can provide them with a letter stating she is ready for transition.  Also this is something that will not be covered by Medicaid.   Rachael Rogers

## 2013-05-01 NOTE — Telephone Encounter (Signed)
I called and spoke with the patient regarding this referral.  Informed her that Medicaid would not cover hormone therapy.  I encouraged her to call Dr. Mariella Saa office to find out the cost of treatment.   She is currently seeing a therapist for gender transition.  Her therapist just changed, but she is meeting the new therapist in the next few weeks.  She will have the therapist send me documentation regarding her readiness for transition.  At that time, if the cost if not prohibitive, I will resend the referral.

## 2013-05-07 ENCOUNTER — Ambulatory Visit (HOSPITAL_COMMUNITY)
Admission: RE | Admit: 2013-05-07 | Discharge: 2013-05-07 | Disposition: A | Discharge status: Home / Self Care | Payer: Medicaid Other | Source: Ambulatory Visit | Attending: Family Medicine | Admitting: Family Medicine

## 2013-05-07 DIAGNOSIS — M25562 Pain in left knee: Secondary | ICD-10-CM

## 2013-05-07 DIAGNOSIS — M25569 Pain in unspecified knee: Secondary | ICD-10-CM | POA: Insufficient documentation

## 2013-05-08 ENCOUNTER — Ambulatory Visit (INDEPENDENT_AMBULATORY_CARE_PROVIDER_SITE_OTHER): Payer: Medicaid Other | Admitting: Emergency Medicine

## 2013-05-08 ENCOUNTER — Encounter: Payer: Self-pay | Admitting: Emergency Medicine

## 2013-05-08 VITALS — BP 145/92 | HR 81 | Ht 62.0 in | Wt 341.0 lb

## 2013-05-08 DIAGNOSIS — M25561 Pain in right knee: Secondary | ICD-10-CM

## 2013-05-08 DIAGNOSIS — M25569 Pain in unspecified knee: Secondary | ICD-10-CM

## 2013-05-08 DIAGNOSIS — J4550 Severe persistent asthma, uncomplicated: Secondary | ICD-10-CM

## 2013-05-08 DIAGNOSIS — J45909 Unspecified asthma, uncomplicated: Secondary | ICD-10-CM

## 2013-05-08 DIAGNOSIS — I10 Essential (primary) hypertension: Secondary | ICD-10-CM

## 2013-05-08 MED ORDER — METFORMIN HCL 500 MG PO TABS: 500.0000 mg | ORAL_TABLET | Freq: Every day | ORAL | Status: DC

## 2013-05-08 MED ORDER — ALBUTEROL SULFATE HFA 108 (90 BASE) MCG/ACT IN AERS: 2.0000 | INHALATION_SPRAY | Freq: Four times a day (QID) | RESPIRATORY_TRACT | Status: DC | PRN

## 2013-05-08 NOTE — Assessment & Plan Note (Signed)
Much improved today despite missing her meds this morning. Will recheck creatinine on ACE-I. Continue current medications. Follow up in 3 months.

## 2013-05-08 NOTE — Assessment & Plan Note (Signed)
Mild osteoarthritis. Continue meloxicam prn. Tylenol prn. No motrin/advil/aleve with meloxicam. Encouraged weight loss. Follow up prn.

## 2013-05-08 NOTE — Assessment & Plan Note (Signed)
She is working on weight loss. Continue exercises in the pool. Will start metformin 500mg  qAM to help with appetite. Follow up in 3 months.

## 2013-05-08 NOTE — Progress Notes (Signed)
  Subjective:    Patient ID: Rachael Rogers, female    DOB: 06-29-1976, 37 y.o.   MRN: [id number]  HPI Rachael Rogers is here for f/u of knee pain and hypertension.  Hypertension Well controlled: no - much improved from last visit.   Compliant with medication: yes - did miss her medication this morning. Side effects from medication: yes dry cough, not bothersome Check BP at home: no  Chest pain: no Vision changes: no Leg edema: no Dizziness: no  Obesity She is very interested in losing weight.  Has been more active in the last 2 weeks walking the dog and trying to watch her portion size.  Her girlfriend is invested in helping her achieve some weight loss.  Also has been going to the pool, which she really enjoys.  Knee pain Worse on the right than the left.  Does have some mild arthritis, reviewed x-rays with patient.  Report significant improvement with the meloxicam and tylenol.    I have reviewed and updated the following as appropriate: allergies and current medications SHx: current smoker  Review of Systems See HPI    Objective:   Physical Exam BP 145/92  Pulse 81  Ht 5\' 2"  (1.575 m)  Wt 341 lb (154.677 kg)  BMI 62.35 kg/m2 Gen: alert, cooperative, NAD HEENT: AT/[place], sclera white, MMM Neck: suppl CV: RRR, no murmurs Pulm: CTAB, no wheezes or rales Ext: no edema, 2+ DP pulses bilaterally Knees: + crepitus bilaterally; no effusions     Assessment & Plan:

## 2013-05-08 NOTE — Patient Instructions (Addendum)
It was nice to see you!  I'm glad the medicine is helping with your knees. Stay as active as you can and continue to work on weight loss - this will help slow the progression of the arthritis.  We are checking some blood work today.  I will call you if anything is wrong, otherwise you will get a letter with the results in the mail in 1-2 weeks.  Start taking Metformin 500mg  with breakfast.  This will help curb your appetite.  Follow up in 3 months or sooner if needed.

## 2013-05-09 ENCOUNTER — Encounter: Payer: Self-pay | Admitting: Emergency Medicine

## 2013-05-09 LAB — BASIC METABOLIC PANEL
BUN: 15 mg/dL (ref 6–23)
CO2: 26 mEq/L (ref 19–32)
Calcium: 9.5 mg/dL (ref 8.4–10.5)
Creat: 0.73 mg/dL (ref 0.50–1.10)
Glucose, Bld: 102 mg/dL — ABNORMAL HIGH (ref 70–99)

## 2013-05-13 ENCOUNTER — Ambulatory Visit: Payer: Medicaid Other | Admitting: Sports Medicine

## 2013-05-27 ENCOUNTER — Encounter: Payer: Self-pay | Admitting: Sports Medicine

## 2013-05-27 ENCOUNTER — Ambulatory Visit (INDEPENDENT_AMBULATORY_CARE_PROVIDER_SITE_OTHER): Payer: Medicaid Other | Admitting: Sports Medicine

## 2013-05-27 VITALS — BP 150/95 | HR 69 | Ht 62.0 in | Wt 341.0 lb

## 2013-05-27 DIAGNOSIS — M67432 Ganglion, left wrist: Secondary | ICD-10-CM

## 2013-05-27 DIAGNOSIS — G5620 Lesion of ulnar nerve, unspecified upper limb: Secondary | ICD-10-CM

## 2013-05-27 DIAGNOSIS — M6740 Ganglion, unspecified site: Secondary | ICD-10-CM

## 2013-05-27 DIAGNOSIS — G5622 Lesion of ulnar nerve, left upper limb: Secondary | ICD-10-CM

## 2013-05-27 NOTE — Patient Instructions (Addendum)
It was nice to see you today, thanks for coming in!  Problem List Items Addressed This Visit   Ulnar neuropathy     EMG through MW Avoid placing direct pressure on medial aspect of elbow    Ganglion of left wrist - Primary     Eccentric Extensor Exercises Continue Mobic ICE PRN >can consider injection but declined at this time      Please Bring all medications or accurate medication list with you to each appointment; an accurate medication list is essential in providing you the best care possible.    You have been scheduled for a nerve conduction study on 06/04/13 at 9:00 am at [facility].  Their office is located at [facility] [place], and phone number is [phone].  Please do not wear long sleeves or lotion on your arm to the appointment

## 2013-05-27 NOTE — Assessment & Plan Note (Addendum)
Pt with a long standing history of ulnar distribution neuropathic symptoms.  Given extent and reproducibility without evidence for cause will have pt obtain Nerve Conduction Study.  I have some concern for potential infiltrative disease (sarcoid) as potential cause of symptoms given significant respiratory symptoms and significant findings on exam.  Irregardless of findings on nerve conduction study I feel that a skin biopsy of a healing lesion from her forearm is warranted to evaluate for sarcoid.  I have discussed this with the patient and she will follow up at the [facility] with either Dr. Elwyn Reach or myself to further discuss/perform a punch biopsy.  Given hx of chronic bronchitis severe enough for her to be granted disability it may be worthwhile having her further evaluated by PULM as she is a quite complex patient.  For the time being pt is to avoid any direct pressure on the medial aspect of the elbow as direct pressure may be exacerbating/causing her symptoms.

## 2013-05-27 NOTE — Progress Notes (Signed)
[facility]  Patient name: Rachael Rogers MRN [id number]  Date of birth: [birth year]   CC & HPI:  Rachael Rogers is a 37 y.o. female presenting today to Establish Care.   Chief Complaint  Patient presents with  . Wrist Pain    Left Left hand dominant pt with  long standing history for >2 years.  Has been progressive.  Generalized pain on dorsum of hand with numbness burning and tingling on ulnar aspect of wrist and hand.  Pain reported with wrist flexion, extension, supination.  Numbness tingling made worse with pressure on medial elbow.   ? Injury while lifting couch in 2010 but no fall or specific trauma   Tried mobic without relief of either pain   associated weakness and nodule on dorsum of left hand.  Nodule is most painful to touch, has been progressively developing over the past 6 months   worsened by any movement   No recent weight loss Hx of Morbid Obesity, hirsutism with Gender Identity Disorder On disability for chronic bronchitis Hx of significant spontaneous skin lesions and easy scarring          ROS:  ROS per HPI  Pertinent History Reviewed:  Medical & Surgical Hx:   Her chronic active medical problems include: Patient Active Problem List   Diagnosis Date Noted  . Bilateral knee pain 04/24/2013  . Ganglion of left wrist 04/24/2013  . Headache 02/07/2012  . Medication management 12/27/2011  . Obesity, Class III, BMI 40-49.9 (morbid obesity) 12/27/2011  . Hypertension 10/27/2011  . Tobacco abuse 10/27/2011  . Rectal bleeding 10/27/2011  . Sleep apnea 10/27/2011  . Asthma, severe persistent, poorly-controlled 10/27/2011  . Gender identity disorder 10/27/2011  . Irregular menses 10/27/2011   On disability due to her chronic recurrent Bronchitis  Current Home Medications:  Prior to Admission medications   Medication Sig Start Date End Date Taking? Authorizing Provider  albuterol (PROVENTIL HFA;VENTOLIN HFA) 108 (90 BASE) MCG/ACT inhaler Inhale 2  puffs into the lungs every 6 (six) hours as needed for wheezing. 05/08/13  Yes Phebe Colla, MD  albuterol (PROVENTIL) (2.5 MG/3ML) 0.083% nebulizer solution Take 3 mLs (2.5 mg total) by nebulization every 6 (six) hours as needed for wheezing. 04/24/13 04/24/14 Yes Phebe Colla, MD  lisinopril-hydrochlorothiazide (PRINZIDE,ZESTORETIC) 10-12.5 MG per tablet Take 1 tablet by mouth daily. 04/24/13 04/24/14 Yes Phebe Colla, MD  meloxicam (MOBIC) 15 MG tablet Take 1 tablet (15 mg total) by mouth daily. 04/24/13  Yes Phebe Colla, MD  metFORMIN (GLUCOPHAGE) 500 MG tablet Take 1 tablet (500 mg total) by mouth daily with breakfast. 05/08/13   Phebe Colla, MD    Social History: Reviewed -  reports that she has been smoking Cigarettes.  She has a 7.5 pack-year smoking history. She has never used smokeless tobacco.  Objective Findings:  Vitals: BP 150/95  Pulse 69  Ht 5\' 2"  (1.575 m)  Wt 341 lb (154.677 kg)  BMI 62.35 kg/m2  PE: GENERAL: Morbidly obese, hursitistic AA  female. In no discomfort; no respiratory distress  PSYCH:  alert and appropriate, good insight   Skin Multiple papular lesions on extensor surfaces of B hands and volar aspects of forearms and legs.  nodular lesion ~2cm in diameter on volar aspect of R forearm resembiling erythema nodosum  NeuroMSK Left Wrisit Exam: Appear:  see above, nodule on dorsum of left wrist   Palp:  Nodule <1cm TTP,  TTP of Extensor tendons diffusely at insertion (  lateral epicondyle) and muscle belly and over dorsum of wrist diffusely tender + Tinnels sign over medial epicondylar groove at elbow & Guyons canal at wrist causing sx in ulnar distribution  ROM:  limited active wrist extension and supination  NV:   warm well perfused, strength is 4/5 in all hand motions but seems limited by pain  Testing:  + hand shake test     MSK [place] Hypoechoic nodule <1cm with increased vascularity on volar aspect of hand communicating down to extensor tendons     Assessment & Plan:    1. Ganglion of left wrist   2. Ulnar neuropathy, left    See problem associated charting

## 2013-05-27 NOTE — Assessment & Plan Note (Addendum)
Likely reactive to extensor tendonopathy.   Eccentric Extensor Exercises Continue Mobic ICE PRN >can consider injection/aspiration but declined at this time

## 2013-07-09 ENCOUNTER — Telehealth: Payer: Self-pay | Admitting: *Deleted

## 2013-07-09 ENCOUNTER — Telehealth: Payer: Self-pay | Admitting: Sports Medicine

## 2013-07-09 NOTE — Telephone Encounter (Signed)
I spoke with the patient on the phone today after reviewing the EMG/nerve conduction study done on her left upper extremity. 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 This certainly explains her several month history of ulnar neuropathy. I would like to refer the patient to Dr. Janee Morn, a local orthopedic hand specialist, for further workup and treatment. She may very well have a compressive lesion in the cubital tunnel but needs to be surgically addressed. I would defer further workup and treatment to his discretion.

## 2013-07-09 NOTE — Telephone Encounter (Signed)
Per Dr. Margaretha Sheffield scheduled pt for appt with Dr. Janee Morn at [facility] for L compressive ulnar mononeuropathy- appt is 07/12/13 at 1pm.  Pt notified of appt info.

## 2013-10-07 ENCOUNTER — Ambulatory Visit (INDEPENDENT_AMBULATORY_CARE_PROVIDER_SITE_OTHER): Payer: Medicaid Other | Admitting: Emergency Medicine

## 2013-10-07 ENCOUNTER — Encounter: Payer: Self-pay | Admitting: Emergency Medicine

## 2013-10-07 VITALS — BP 184/118 | HR 72 | Ht 62.0 in | Wt 344.0 lb

## 2013-10-07 DIAGNOSIS — K439 Ventral hernia without obstruction or gangrene: Secondary | ICD-10-CM | POA: Insufficient documentation

## 2013-10-07 DIAGNOSIS — I10 Essential (primary) hypertension: Secondary | ICD-10-CM

## 2013-10-07 DIAGNOSIS — N926 Irregular menstruation, unspecified: Secondary | ICD-10-CM

## 2013-10-07 DIAGNOSIS — R1013 Epigastric pain: Secondary | ICD-10-CM

## 2013-10-07 MED ORDER — LISINOPRIL-HYDROCHLOROTHIAZIDE 10-12.5 MG PO TABS: 1.0000 | ORAL_TABLET | Freq: Every day | ORAL | Status: DC

## 2013-10-07 MED ORDER — OMEPRAZOLE 40 MG PO CPDR: DELAYED_RELEASE_CAPSULE | ORAL | Status: DC

## 2013-10-07 NOTE — Assessment & Plan Note (Signed)
Suspect PCOS clinically. Will check LH, FSH, and testosterone.

## 2013-10-07 NOTE — Progress Notes (Signed)
   Subjective:    Patient ID: Rachael Rogers, female    DOB: [birth year], 37 y.o.   MRN: [id number]  HPI Rachael Rogers is here for abdominal pain.  Abdominal pain She reports epigastric abdominal pain for the last 3 months.  Described as "heartburn."  Present most of the time, worse with pork and worse with lying on her stomach.  She drinks an herbal tea which soothes the pain.  Associated with nausea but no vomiting.  Feels a "knot" above her belly button.  Also reports 5-6 watery BM a day with tenesmus and anal itching.  States had a little blood in the stool 3 weeks ago, but none in the last 3 weeks.  Denies constipation, but states she spends "hours on the toilet."  No fevers or chills.  No weight loss.  Hypertension Compliant with medication: no - no meds x1 month Side effects from medication: no Check BP at home: no  Low salt diet: yes Exercise: yes  Chest pain: no Palpitations: no Vision changes: no Leg edema: no Dizziness: no  I have reviewed and updated the following as appropriate: allergies and current medications SHx: current smoker  Health Maintenance: declined flu shot  Review of Systems See HPI    Objective:   Physical Exam BP 184/118  Pulse 72  Ht 5\' 2"  (1.575 m)  Wt 344 lb (156.037 kg)  BMI 62.90 kg/m2 Gen: alert, cooperative, NAD HEENT: AT/[place], sclera white, MMM Neck: supple CV: RRR, no murmurs Abd: +BS, morbidly obese, soft, ND, tender in epigastric with possible subcutaneous mass      Assessment & Plan:

## 2013-10-07 NOTE — Assessment & Plan Note (Signed)
Reflux vs ulcer vs ventral hernia. Will check POCT h pylori today - treat if positive. Start omeprazole 40mg  BID x2 weeks, then daily. Follow up in 2 weeks. Anticipate CT scan to evaluate for possible hernia.

## 2013-10-07 NOTE — Assessment & Plan Note (Signed)
Elevated, but has not taken medications in over 1 month. Restart lisinopril-hctz 10-12.5 daily. Follow up in 2-4 weeks.

## 2013-10-07 NOTE — Patient Instructions (Signed)
It was nice to see you!  Please restart your blood pressure medicine.  Please take omeprazole 1 pill twice a day for 2 weeks, then daily.  Follow up in 2-4 weeks so we can review your labs and follow up your belly pain.

## 2013-10-21 ENCOUNTER — Ambulatory Visit (INDEPENDENT_AMBULATORY_CARE_PROVIDER_SITE_OTHER): Payer: Medicaid Other | Admitting: Emergency Medicine

## 2013-10-21 ENCOUNTER — Encounter: Payer: Self-pay | Admitting: Emergency Medicine

## 2013-10-21 VITALS — BP 154/97 | HR 95 | Temp 98.7°F | Ht 62.0 in | Wt 338.0 lb

## 2013-10-21 DIAGNOSIS — I10 Essential (primary) hypertension: Secondary | ICD-10-CM

## 2013-10-21 DIAGNOSIS — J029 Acute pharyngitis, unspecified: Secondary | ICD-10-CM | POA: Insufficient documentation

## 2013-10-21 DIAGNOSIS — J020 Streptococcal pharyngitis: Secondary | ICD-10-CM

## 2013-10-21 DIAGNOSIS — R1013 Epigastric pain: Secondary | ICD-10-CM

## 2013-10-21 LAB — BASIC METABOLIC PANEL
BUN: 10 mg/dL (ref 6–23)
CALCIUM: 9 mg/dL (ref 8.4–10.5)
CO2: 27 mEq/L (ref 19–32)
CREATININE: 0.7 mg/dL (ref 0.50–1.10)
Chloride: 97 mEq/L (ref 96–112)
Glucose, Bld: 97 mg/dL (ref 70–99)
Potassium: 3.9 mEq/L (ref 3.5–5.3)
Sodium: 135 mEq/L (ref 135–145)

## 2013-10-21 LAB — POCT RAPID STREP A (OFFICE): RAPID STREP A SCREEN: NEGATIVE

## 2013-10-21 MED ORDER — LISINOPRIL-HYDROCHLOROTHIAZIDE 20-25 MG PO TABS: 1.0000 | ORAL_TABLET | Freq: Every day | ORAL | Status: DC

## 2013-10-21 NOTE — Assessment & Plan Note (Signed)
Remains elevated, but much improved. Increase lisinopril-hctz to 20-25mg  daily. Recheck BMP today. Follow up in 1 month.

## 2013-10-21 NOTE — Patient Instructions (Signed)
It was nice to see you!  I'm glad the stomach is doing better. Keep avoiding the beef. Go down to 1 omeprazole a day as scheduled this weekend.  Your blood pressure is much better today, but not quite where we want it. Increase the medicine to 2 pills once a day.  I sent in a new prescription with the higher dosage.  You have a viral pharyngitis. - tea with lemon and honey will help - alternate tylenol and motrin for headache - make sure you are drinking plenty of fluids - use chloraseptic spray to help with the sore throat - use mucinex to help with the congestion - If you develop fevers or are not getting better by the end of the week, come back.   Follow up in 1 month.

## 2013-10-21 NOTE — Progress Notes (Signed)
   Subjective:    Patient ID: Rachael Rogers, female    DOB: 1976/05/18, 38 y.o.   MRN: [id number]  HPI Rachael Rogers is here for f/u and sore throat.  HTN She is taking her medication as prescribed.  Takes it at bedtime.  No side effects.  No chest pain or palpitations.  Abdominal pain Reports that this is much improved.  Has definitively tied it to consumption of beef.  H pylori was negative.  She is taking omeprazole BID through this week, then will change to once a day.  Sore throat Reports sore throat for last 2 days.  Associated with headache, chest congestion, mild nasal congestion, mild cough.  No fevers.  Appetite is good. No nausea, vomiting, diarrhea.  Taking tylenol as needed for headache.  Does have a nephew who was sick with similar symptoms last week.  I have reviewed and updated the following as appropriate: allergies and current medications SHx: current smoker    Review of Systems See HPI    Objective:   Physical Exam BP 154/97  Pulse 95  Temp(Src) 98.7 F (37.1 C) (Oral)  Ht 5\' 2"  (1.575 m)  Wt 338 lb (153.316 kg)  BMI 61.81 kg/m2 Gen: alert, cooperative, NAD HEENT: AT/[place], sclera white, MMM, mild pharyngeal erythema without exudate Neck: supple, no LAD CV: RRR, no murmurs Pulm: CTAB, no wheezes or rales      Assessment & Plan:

## 2013-10-21 NOTE — Assessment & Plan Note (Signed)
Improved.  May have allergy to beef component. Continue omeprazole. Avoid beef.

## 2013-10-21 NOTE — Assessment & Plan Note (Signed)
Likely viral. No fevers and minimal cough making flu unlikely. Rapid strep is negative. Symptomatic care as in AVS.

## 2013-11-21 ENCOUNTER — Ambulatory Visit: Payer: Medicaid Other | Admitting: Emergency Medicine

## 2013-12-06 ENCOUNTER — Ambulatory Visit: Payer: Medicaid Other | Admitting: Emergency Medicine

## 2014-02-06 ENCOUNTER — Ambulatory Visit: Payer: Medicaid Other | Admitting: Emergency Medicine

## 2014-02-06 ENCOUNTER — Telehealth: Payer: Self-pay | Admitting: Emergency Medicine

## 2014-02-06 DIAGNOSIS — F649 Gender identity disorder, unspecified: Secondary | ICD-10-CM

## 2014-02-06 NOTE — Telephone Encounter (Signed)
Attempted to call patient.  Went to [employer]email.  Left message that I will try again tomorrow.  If there is an emergency she can call the clinic and get directed to the on call physician.

## 2014-02-06 NOTE — Telephone Encounter (Signed)
Patient unable to keep appt today due to her wife getting sick at dialysis today. She is requesting for Dr. Piedad ClimesHonig to give her call if possible [phone]

## 2014-02-07 NOTE — Telephone Encounter (Signed)
Called and spoke with patient.  She would like a referral to [place]. Ruby [place]itt[place] ([place] as she has the needed letters now.  She will reschedule her appt with me for next week.

## 2014-05-08 ENCOUNTER — Ambulatory Visit: Payer: Medicaid Other | Admitting: Family Medicine

## 2014-05-19 ENCOUNTER — Ambulatory Visit (INDEPENDENT_AMBULATORY_CARE_PROVIDER_SITE_OTHER): Payer: Medicaid Other | Admitting: Family Medicine

## 2014-05-19 ENCOUNTER — Encounter: Payer: Self-pay | Admitting: Family Medicine

## 2014-05-19 VITALS — BP 175/105 | HR 79 | Temp 98.1°F | Ht 62.0 in | Wt 353.5 lb

## 2014-05-19 DIAGNOSIS — G4733 Obstructive sleep apnea (adult) (pediatric): Secondary | ICD-10-CM

## 2014-05-19 DIAGNOSIS — F649 Gender identity disorder, unspecified: Secondary | ICD-10-CM

## 2014-05-19 DIAGNOSIS — F640 Transsexualism: Secondary | ICD-10-CM

## 2014-05-19 DIAGNOSIS — R1013 Epigastric pain: Secondary | ICD-10-CM

## 2014-05-19 DIAGNOSIS — I10 Essential (primary) hypertension: Secondary | ICD-10-CM

## 2014-05-19 MED ORDER — LISINOPRIL-HYDROCHLOROTHIAZIDE 20-25 MG PO TABS: 1.0000 | ORAL_TABLET | Freq: Every day | ORAL | Status: DC

## 2014-05-19 NOTE — Assessment & Plan Note (Addendum)
Subacute worsening of central abdominal pain associated with palpable mass - Concern for hernia, exam inconclusive due to body habitus - Multifactorial etiology for abd pain, likely component of uncontrolled GERD (self discontinued PPI)  Plan: 1. R/o hernia with Abdominal CT w/o IV contrast (oral contrast only) - scheduled for 05/28/14 2. Continue dietary modifications - inc yogurt, avoid beef and certain triggers 3. RTC to discuss CT results and f/u GERD at next apt 2-4 weeks

## 2014-05-19 NOTE — Progress Notes (Signed)
Subjective:     Patient ID: Rachael Rogers, female   DOB: 1976-02-27, [age] y.o.   MRN: [id number]  HPI  Abdominal Pain: - Reports previous hx of abdominal pain and was told that it may be a hernia - States has had a central "bulging" area for > 1 year, gradually worsening and now with more persistently increased pain and discomfort, too uncomfortable to eat, feels like it shifts but does not completely go away - Worse with laying on stomach, straining / lifting, irritated by some foods, improved with yogurt - Concern previously for GERD, however self discontinued Omeprazole d/t GI discomfort - Trying Motrin, and Tylenol with Codene, and Tramadol not working - Denies nausea / vomiting, bloody stools, constipation / diarrhea  CHRONIC HTN: Reports - self discontinued Lisinopril-HCTZ, does not like taking medications, wanted fluid only pill Current Meds - none   Previously poor compliance Lifestyle - limited regular exercise Denies CP, dyspnea, HA, edema, dizziness / lightheadedness  Hx OSA / Insomnia: - Difficulty falling asleep at night, admits to inc sleeping during the day - Previously seen for sleep study, had night-time sleep study - Hx OSA, previously tried to get CPAP, need to follow-up with prior sleep study  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 Ruby ColaPittaway), has had referral, waiting to go to second f/u apt - Testosterone 200mg  q 1 month, about 2 mL q 2 weeks, through [facility] - has 2 more refills  I have reviewed and updated the following as appropriate: allergies and current medications  Social Hx: [place] - Active [place]  Review of Systems  See above HPI    Objective:   Physical Exam  BP 175/105  Pulse 79  Temp(Src) 98.1 F (36.7 C) (Oral)  Ht 5\' 2"  (1.575 m)  Wt 353 lb 8 oz (160.347 kg)  BMI 64.64 kg/m2  Gen - morbidly obese, cooperative, and conversational, NAD HEENT - MMM Neck - supple,  non-tender Heart - RRR, no murmurs heard Lungs - CTAB. Diminished breath sounds due to body habitus. Normal work of breathing. Abd - obese abdomen with large pannus, central lower epigastric palpable abdominal mass +tender to palpation, difficulty to determine size and mobility, +active BS Ext - bilateral +1 pitting LE edema, intact peripheral pulses Skin - warm, dry Neuro - awake, alert, oriented, grossly non-focal     Assessment:     See specific A&P problem list for details.      Plan:     See specific A&P problem list for details.

## 2014-05-19 NOTE — Assessment & Plan Note (Signed)
Hx consistent with OSA, and previously diagnosed from sleep study - Had arrangements for follow-up sleep study and picking up CPAP, patient has not followed through with these  Plan: 1. Follow-up with previous sleep study and obtain CPAP machine 2. Advised importance of CPAP / treating OSA, to lower BP and reduce fatigue / insomnia 3. RTC PRN, will place referral if needed

## 2014-05-19 NOTE — Assessment & Plan Note (Signed)
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 Rosario Jacks [facility]) - Continues with Testosterone injections

## 2014-05-19 NOTE — Assessment & Plan Note (Signed)
Poorly controlled - Self discontinued HCTZ-Lisinopril - Re-checked BP, mildly improved to 160/95 No complications   Plan:  1. Restart Lisinopril-HCTZ 20-25mg  daily - advised on anti-HTN meds, may need a 3rd and possibly 4th agent in future  2. Consider repeat BMET at next follow-up BP check after resuming ACEi, previously Cr stable (last checked 10/2013) 3. Lifestyle Mods - Weight loss, Smoking cessation, Exercise, Dec salt intake, inc K+ rich vegs 4. Monitor BP at home or at drug store occasionally. 5. RTC 1 month for BP re-check

## 2014-05-19 NOTE — Patient Instructions (Signed)
Dear Rachael BlazerMaria Rogers, Thank you for coming in to clinic today. It was good to meet you and your wife.  Today we discussed your Abdominal Pain. 1. For your abdominal pain, I am concerned for a possible hernia as well. 2. Ordered Abdominal CT scan with oral contrast. We will schedule this today, please follow instructions [facility], we will discuss results at a future appointment. 3. For your BP, it is elevated today, highly recommend resuming prior BP med, I sent refills to your pharmacy 4. For your Insomnia / Sleep Apnea - I strongly advise you to return to get the follow-up appointment and sleep study. It sounds like you need a CPAP machine to help breath, this will lower your BP and help you feel rested 5. Follow-up with your Endocrine doctor as needed.  Some important numbers from today's visit: BP - 170/100 Results -  Please schedule a follow-up appointment with me (Dr. Althea CharonKaramalegos) in 2-4 weeks to follow-up.  If you have any other questions or concerns, please feel free to call the clinic to contact me. You may also schedule an earlier appointment if necessary.  However, if your symptoms get significantly worse, please go to the Emergency Department to seek immediate medical attention.  Rachael PilarAlexander Zaylon Bossier, DO [facility]

## 2014-05-21 ENCOUNTER — Other Ambulatory Visit: Payer: Medicaid Other

## 2014-05-28 ENCOUNTER — Ambulatory Visit
Admission: RE | Admit: 2014-05-28 | Discharge: 2014-05-28 | Disposition: A | Discharge status: Home / Self Care | Payer: Medicaid Other | Source: Ambulatory Visit | Attending: Family Medicine | Admitting: Family Medicine

## 2014-05-28 ENCOUNTER — Telehealth: Payer: Self-pay | Admitting: Family Medicine

## 2014-05-28 DIAGNOSIS — R1013 Epigastric pain: Secondary | ICD-10-CM

## 2014-05-28 DIAGNOSIS — G4733 Obstructive sleep apnea (adult) (pediatric): Secondary | ICD-10-CM

## 2014-05-28 NOTE — Telephone Encounter (Signed)
Pt called and would like to have a sleep study done. Can we put a referral for this in. jw

## 2014-05-28 NOTE — Telephone Encounter (Signed)
Referral ordered for sleep study, [facility], has contacted that office and will work on arranging this sleep study, requested day-time sleep study with CPAP.  Rachael PilarAlexander Francessca Friis, DO [facility], PGY-2

## 2014-05-29 ENCOUNTER — Telehealth: Payer: Self-pay | Admitting: Family Medicine

## 2014-05-29 DIAGNOSIS — G4733 Obstructive sleep apnea (adult) (pediatric): Secondary | ICD-10-CM
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
## 2014-07-02 ENCOUNTER — Ambulatory Visit (HOSPITAL_BASED_OUTPATIENT_CLINIC_OR_DEPARTMENT_OTHER): Payer: Medicaid Other | Attending: Family Medicine | Admitting: Radiology

## 2014-07-02 VITALS — Ht 61.0 in

## 2014-07-02 DIAGNOSIS — G4733 Obstructive sleep apnea (adult) (pediatric): Secondary | ICD-10-CM

## 2014-07-02 DIAGNOSIS — R0683 Snoring: Secondary | ICD-10-CM

## 2014-07-04 ENCOUNTER — Telehealth: Payer: Self-pay | Admitting: Family Medicine

## 2014-07-04 DIAGNOSIS — J4550 Severe persistent asthma, uncomplicated: Secondary | ICD-10-CM

## 2014-07-04 NOTE — Telephone Encounter (Signed)
Sent prescription for DME Nebulizer machine to pharmacy, with note to pharmacist to fax back paper script if needed for attending physician authorization if required.  Saralyn Pilar, DO [facility], PGY-2

## 2014-07-04 NOTE — Telephone Encounter (Signed)
Pt called and needs the actual nebulizer machine called in. Not the solution. jw

## 2014-07-05 DIAGNOSIS — G4733 Obstructive sleep apnea (adult) (pediatric): Secondary | ICD-10-CM

## 2014-07-05 DIAGNOSIS — R0989 Other specified symptoms and signs involving the circulatory and respiratory systems: Secondary | ICD-10-CM

## 2014-07-05 DIAGNOSIS — R0609 Other forms of dyspnea: Secondary | ICD-10-CM

## 2014-07-05 NOTE — Sleep Study (Signed)
   NAME: Rachael Rogers DATE OF BIRTH:  Sep 07, 1976 MEDICAL RECORD NUMBER [id number]  LOCATION: [facility]  PHYSICIAN: Zafirah Vanzee D  DATE OF STUDY: 07/02/2014  SLEEP STUDY TYPE: Nocturnal Polysomnogram               REFERRING PHYSICIAN: Saralyn Pilar,*  INDICATION FOR STUDY: Insomnia with sleep apnea. Daytime study scheduled to correspond to her normal sleep schedule.  EPWORTH SLEEPINESS SCORE:   11/24 HEIGHT:  (154.9 cm)  WEIGHT:  (348#)    Body mass index is 0.00 kg/(m^2).  NECK SIZE: 17.5 in.  MEDICATIONS: Charted for review  SLEEP ARCHITECTURE: Total sleep time 305.5 minutes with sleep efficiency 84.9%. Stage I was 16.4%, stage II 63.3%, stage III absent, REM 20.3% of total sleep time. Sleep latency 3.5 minutes, REM latency 65.5 minutes, awake after sleep onset 52 minutes, arousal index 22.6, bedtime medication: None.  Lights out at 11:34 AM, lights on at 1730 4 PM  RESPIRATORY DATA: Apnea hypopneas index (AHI) 19.6 per hour. 100 total events scored including 2 obstructive apneas and 98 hypopneas. Events were not positional. REM AHI 56.1 per hour. This study was ordered as a diagnostic polysomnogram-CPAP titration was not done.  OXYGEN DATA: Very loud snoring with oxygen desaturation to a nadir of 84% and mean saturation 94.7% on room air.  CARDIAC DATA: Sinus rhythm with occasional PVCs  MOVEMENT/PARASOMNIA: No significant movement disturbance, bathroom x1  IMPRESSION/ RECOMMENDATION:   1) This study was a diagnostic polysomnogram done on a daytime schedule to correspond to the patient's usual sleep pattern. The study began at 11:34 AM and ended at 1734 PM  2) Moderate obstructive sleep apnea/hypopneas syndrome, AHI 19.6 per hour with non-positional events. REM AHI 56.1 per hour. Very loud snoring with oxygen desaturation to a nadir of 84% and mean saturation 94.7% on room air. 3) At this study was ordered without CPAP titration. If  appropriate, the patient can return for dedicated CPAP titration study. 4) A previous polysomnogram on 11/16/2009 record AHI 22 per hour. A subsequent study in 11/27/2012 record AHI 17.8 per hour with body weight 305 pounds. We have no record that CPAP titration has ever been done.  Waymon Budge Diplomate, American Board of Sleep Medicine  ELECTRONICALLY SIGNED ON:  07/05/2014, 12:21 PM [facility] PH: [phone]   FX: [phone] ACCREDITED BY THE AMERICAN ACADEMY OF SLEEP MEDICINE

## 2014-07-11 ENCOUNTER — Telehealth: Payer: Self-pay | Admitting: Family Medicine

## 2014-07-11 DIAGNOSIS — G4733 Obstructive sleep apnea (adult) (pediatric): Secondary | ICD-10-CM

## 2014-07-11 NOTE — Telephone Encounter (Signed)
[employer], and they do not carry nebulizer machines, unable to order, advised medical supply store. [facility] [phone]) and they carry nebulizer machines, no prescription required, $49 out of pocket, do not take any insurance including medicaid/medicare.  Additionally, reviewed patient's recent sleep study results, confirmation of Obstructive Sleep Apnea, recommend repeat Sleep Study at [facility] with CPAP titration. I have placed this order, and called [facility] to schedule this appointment.  Please call patient and let them know the following: 1. can purchase Nebulizer out of pocket as above (or if they choose to wait, I will look into alternative options in clinic next week) 2. CPAP titration sleep study ordered, [facility] will call to schedule appointment 3. Schedule follow-up within 1-2 weeks for paperwork as requested  Thanks, Saralyn Pilar, DO [facility], PGY-2

## 2014-07-11 NOTE — Telephone Encounter (Signed)
Pt called and needs a new nebulizer and everything that goes with it sent to her pharmacy. [facility] on [address] Rachael Rogers

## 2014-07-14 NOTE — Telephone Encounter (Signed)
Patient informed of message from MD, patient then states she has been have some chest congestion with cough, same day appointment scheduled with PCP for 07/16/14.

## 2014-07-16 ENCOUNTER — Other Ambulatory Visit: Payer: Self-pay | Admitting: Family Medicine

## 2014-07-16 ENCOUNTER — Ambulatory Visit: Payer: Medicaid Other | Admitting: Family Medicine

## 2014-07-16 DIAGNOSIS — J4550 Severe persistent asthma, uncomplicated: Secondary | ICD-10-CM

## 2014-07-18 ENCOUNTER — Telehealth: Payer: Self-pay | Admitting: *Deleted

## 2014-07-18 NOTE — Telephone Encounter (Signed)
Left voice message for pt stating RN can gave her a nebulizer machine if she has insurance.  RN need a copy of insurance card and for pt to fill out paperwork.  Pt to return RN call.  Clovis PuMartin, Tamika L, RN

## 2014-07-23 ENCOUNTER — Ambulatory Visit (HOSPITAL_BASED_OUTPATIENT_CLINIC_OR_DEPARTMENT_OTHER): Payer: Medicaid Other | Attending: Family Medicine | Admitting: Radiology

## 2014-07-23 DIAGNOSIS — G4733 Obstructive sleep apnea (adult) (pediatric): Secondary | ICD-10-CM | POA: Diagnosis present

## 2014-07-23 DIAGNOSIS — Z9989 Dependence on other enabling machines and devices: Secondary | ICD-10-CM

## 2014-07-30 DIAGNOSIS — G4733 Obstructive sleep apnea (adult) (pediatric): Secondary | ICD-10-CM

## 2014-07-30 NOTE — Sleep Study (Signed)
   NAME: Rachael Rogers DATE OF BIRTH:  [birth year] MEDICAL RECORD NUMBER [id number]  LOCATION: [facility]  PHYSICIAN: Borden Thune D  DATE OF STUDY: 07/23/2014  SLEEP STUDY TYPE: Nocturnal Polysomnogram- CPAP titration               REFERRING PHYSICIAN: Saralyn PilarKaramalegos, Alexander,*  INDICATION FOR STUDY: Hypersomnia with sleep apnea  EPWORTH SLEEPINESS SCORE:   11/24 HEIGHT:   5 feet 11 inches WEIGHT:   289 pounds    BMI 55 NECK SIZE:   18 in.  MEDICATIONS: Charted for review  SLEEP ARCHITECTURE: Total sleep time 305.5 minutes with sleep efficiency 84.9%. Stage I was 13.4%, stage II 57.8%, stage III absent, REM 28.8% of total sleep time. Sleep latency 1 minute, REM latency 86.5 minutes, awake after sleep onset 24 minutes, arousal index 14.7, bedtime medication: None  RESPIRATORY DATA: CPAP titration protocol. CPAP was titrated to 8 CWP, AHI 0 per hour. She wore a small fullface mask.  OXYGEN DATA: Snoring was prevented by CPAP and mean oxygen saturation was 91.6% on room air.  CARDIAC DATA: Sinus rhythm with PACs  MOVEMENT/PARASOMNIA: No significant movement disturbance, no bathroom trips  IMPRESSION/ RECOMMENDATION:   1) Successful CPAP titration at 8 CWP, AHI 0 per hour. She wore a small [employer] Simplus fullface mask with heated humidifier. Snoring was prevented and mean oxygen saturation was 91.6% on room air. 2) This was performed as a daytime study to accommodate the patient's usual sleep schedule. Lights out at 11:59 AM and lights on at 17:59 PM.  3) A previous polysomnogram at the neurology office on 11/27/2012 recorded AHI of 17.8 per hour with recorded weight 305 pounds. A daytime study on 07/02/2014 recorded AHI 19.6 per hour with a recorded weight of 348 pounds. Note the discrepancy with the recorded weight for the current study at 289 pounds.  Waymon BudgeYOUNG,Jennelle Pinkstaff D Diplomate, American Board of Sleep Medicine  ELECTRONICALLY SIGNED ON:  07/30/2014,  1:14 PM [facility] PH: [phone]   FX: [phone] ACCREDITED BY THE AMERICAN ACADEMY OF SLEEP MEDICINE

## 2014-08-23 ENCOUNTER — Emergency Department (HOSPITAL_COMMUNITY)
Admission: EM | Admit: 2014-08-23 | Discharge: 2014-08-24 | Disposition: A | Discharge status: Home / Self Care | Payer: Medicaid Other | Attending: Emergency Medicine | Admitting: Emergency Medicine

## 2014-08-23 ENCOUNTER — Emergency Department (HOSPITAL_COMMUNITY): Payer: Medicaid Other

## 2014-08-23 ENCOUNTER — Encounter (HOSPITAL_COMMUNITY): Payer: Self-pay | Admitting: *Deleted

## 2014-08-23 DIAGNOSIS — Z720 Tobacco use: Secondary | ICD-10-CM | POA: Diagnosis not present

## 2014-08-23 DIAGNOSIS — R079 Chest pain, unspecified: Secondary | ICD-10-CM

## 2014-08-23 DIAGNOSIS — G4730 Sleep apnea, unspecified: Secondary | ICD-10-CM | POA: Insufficient documentation

## 2014-08-23 DIAGNOSIS — Z8614 Personal history of Methicillin resistant Staphylococcus aureus infection: Secondary | ICD-10-CM | POA: Diagnosis not present

## 2014-08-23 DIAGNOSIS — J45909 Unspecified asthma, uncomplicated: Secondary | ICD-10-CM | POA: Insufficient documentation

## 2014-08-23 DIAGNOSIS — R011 Cardiac murmur, unspecified: Secondary | ICD-10-CM | POA: Diagnosis not present

## 2014-08-23 DIAGNOSIS — R0600 Dyspnea, unspecified: Secondary | ICD-10-CM | POA: Insufficient documentation

## 2014-08-23 DIAGNOSIS — L989 Disorder of the skin and subcutaneous tissue, unspecified: Secondary | ICD-10-CM | POA: Diagnosis present

## 2014-08-23 DIAGNOSIS — R0602 Shortness of breath: Secondary | ICD-10-CM

## 2014-08-23 LAB — COMPREHENSIVE METABOLIC PANEL
ALT: 10 U/L (ref 0–35)
AST: 12 U/L (ref 0–37)
Albumin: 3.2 g/dL — ABNORMAL LOW (ref 3.5–5.2)
Alkaline Phosphatase: 56 U/L (ref 39–117)
Anion gap: 13 (ref 5–15)
BUN: 9 mg/dL (ref 6–23)
CALCIUM: 9.2 mg/dL (ref 8.4–10.5)
CHLORIDE: 101 meq/L (ref 96–112)
CO2: 26 meq/L (ref 19–32)
Creatinine, Ser: 0.9 mg/dL (ref 0.50–1.10)
GFR calc Af Amer: >90 mL/min (ref >90)
GFR, EST NON AFRICAN AMERICAN: 79 mL/min — AB (ref >90)
Glucose, Bld: 107 mg/dL — ABNORMAL HIGH (ref 70–99)
Potassium: 3.8 mEq/L (ref 3.7–5.3)
Sodium: 140 mEq/L (ref 137–147)
Total Protein: 7.5 g/dL (ref 6.0–8.3)

## 2014-08-23 LAB — CBC WITH DIFFERENTIAL/PLATELET
BASOS ABS: 0.1 10*3/uL (ref 0.0–0.1)
Basophils Relative: 0 % (ref 0–1)
EOS PCT: 1 % (ref 0–5)
Eosinophils Absolute: 0.1 10*3/uL (ref 0.0–0.7)
HCT: 45.1 % (ref 36.0–46.0)
Hemoglobin: 14.8 g/dL (ref 12.0–15.0)
LYMPHS PCT: 37 % (ref 12–46)
Lymphs Abs: 4.3 10*3/uL — ABNORMAL HIGH (ref 0.7–4.0)
MCH: 27.5 pg (ref 26.0–34.0)
MCHC: 32.8 g/dL (ref 30.0–36.0)
MCV: 83.8 fL (ref 78.0–100.0)
Monocytes Absolute: 0.9 10*3/uL (ref 0.1–1.0)
Monocytes Relative: 8 % (ref 3–12)
NEUTROS ABS: 6.1 10*3/uL (ref 1.7–7.7)
Neutrophils Relative %: 54 % (ref 43–77)
PLATELETS: 314 10*3/uL (ref 150–400)
RBC: 5.38 MIL/uL — AB (ref 3.87–5.11)
RDW: 15.6 % — ABNORMAL HIGH (ref 11.5–15.5)
WBC: 11.4 10*3/uL — AB (ref 4.0–10.5)

## 2014-08-23 LAB — I-STAT BETA HCG BLOOD, ED (MC, WL, AP ONLY)

## 2014-08-23 MED ORDER — SODIUM CHLORIDE 0.9 % IV BOLUS (SEPSIS)
1000.0000 mL | Freq: Once | INTRAVENOUS | Status: AC
Start: 2014-08-23 — End: 2014-08-24
  Administered 2014-08-23: 1000 mL via INTRAVENOUS

## 2014-08-23 MED ORDER — IPRATROPIUM-ALBUTEROL 0.5-2.5 (3) MG/3ML IN SOLN: 3.0000 mL | Freq: Four times a day (QID) | RESPIRATORY_TRACT | Status: DC | PRN

## 2014-08-23 MED ORDER — ALBUTEROL SULFATE (2.5 MG/3ML) 0.083% IN NEBU
5.0000 mg | INHALATION_SOLUTION | Freq: Once | RESPIRATORY_TRACT | Status: AC
  Administered 2014-08-23: 5 mg via RESPIRATORY_TRACT
  Filled 2014-08-23: qty 6

## 2014-08-23 MED ORDER — IOHEXOL 350 MG/ML SOLN
100.0000 mL | Freq: Once | INTRAVENOUS | Status: AC | PRN
  Administered 2014-08-23: 100 mL via INTRAVENOUS

## 2014-08-23 MED ORDER — IPRATROPIUM BROMIDE 0.02 % IN SOLN
0.5000 mg | Freq: Once | RESPIRATORY_TRACT | Status: AC
  Administered 2014-08-23: 0.5 mg via RESPIRATORY_TRACT
  Filled 2014-08-23: qty 2.5

## 2014-08-23 MED ORDER — PREDNISONE 20 MG PO TABS
60.0000 mg | ORAL_TABLET | ORAL | Status: AC
  Administered 2014-08-23: 60 mg via ORAL
  Filled 2014-08-23: qty 3

## 2014-08-23 NOTE — ED Notes (Signed)
The pt is c/ having difficulty breathing.  Hx of asthma her inhalers aRE NIOT HELPING HER.  SHE ALSO WANTS TO BE SEEN FOR A LESION ON HER LT LOWER FACE.  HX OF MERSA.Marland Kitchen.  LMP NONE.  TRANSGENDER

## 2014-08-23 NOTE — ED Provider Notes (Addendum)
CSN: [id number]     Arrival date & time 08/23/14  2047 History   First MD Initiated Contact with Patient 08/23/14 2113     Chief Complaint  Patient presents with  . asthma and lesion on her face      (Consider location/radiation/quality/duration/timing/severity/associated sxs/prior Treatment) HPI  Patient presents  With 2 chief complaints. Chief complaint #1 left facial lesion. Just lateral to the chin, on the mandibular ridge there is a small lesion that has become present over the past day.  It is sore, tender to touch, without active drainage.   Chief complaint 2 dyspnea.  Patient has a history of asthma.  He states that in spite of taking his typical medication he has had persistent dyspnea, right superior lateral chest pain.time of onset is unclear, but the symptoms have been persistent, and the pain is worse with coughing or deep inspiration in the right upper chest.  Pain is sore, nonradiating. No sick become a near-syncope. Patient is transgender, currently taking testosterone supplements. She also smokes.    Past Medical History  Diagnosis Date  . Asthma   . Sleep apnea 2011  . Heart murmur 1990   Past Surgical History  Procedure Laterality Date  . Appendectomy  1996   Family History  Problem Relation Age of Onset  . Asthma Father   . Heart failure Mother   . Hyperlipidemia Mother   . Hyperlipidemia Father   . Hypertension Mother   . Hypertension Father   . Stroke Maternal Grandmother    History  Substance Use Topics  . Smoking status: Current Every Day Smoker -- 0.30 packs/day for 25 years    Types: Cigarettes  . Smokeless tobacco: Never Used  . Alcohol Use: Yes     Comment: Hx binge drinking - has cut down   OB History    No data available     Review of Systems  Constitutional:       Per HPI, otherwise negative  HENT:       Per HPI, otherwise negative  Respiratory:       Per HPI, otherwise negative  Cardiovascular:       Per HPI, otherwise  negative  Gastrointestinal: Negative for vomiting.  Endocrine:       Negative aside from HPI  Genitourinary:       Neg aside from HPI   Musculoskeletal:       Per HPI, otherwise negative  Skin: Negative.   Neurological: Negative for syncope.      Allergies  Food and Theophyllines  Home Medications   Prior to Admission medications   Medication Sig Start Date End Date Taking? Authorizing Provider  albuterol (PROVENTIL HFA;VENTOLIN HFA) 108 (90 BASE) MCG/ACT inhaler Inhale 2 puffs into the lungs every 6 (six) hours as needed for wheezing. 05/08/13  Yes Charm RingsErin J Honig, MD  testosterone cypionate (DEPOTESTOTERONE CYPIONATE) 200 MG/ML injection Inject 200 mg into the muscle every 14 (fourteen) days. 08/03/14  Yes Historical Provider, MD  albuterol (PROVENTIL) (2.5 MG/3ML) 0.083% nebulizer solution Take 3 mLs (2.5 mg total) by nebulization every 6 (six) hours as needed for wheezing. Patient not taking: Reported on 08/23/2014 04/24/13 04/24/14  Charm RingsErin J Honig, MD  lisinopril-hydrochlorothiazide (PRINZIDE,ZESTORETIC) 20-25 MG per tablet Take 1 tablet by mouth daily. Patient not taking: Reported on 08/23/2014 05/19/14   Saralyn PilarAlexander Karamalegos, DO  omeprazole (PRILOSEC) 40 MG capsule Take i pill twice a day for 2 weeks, then daily Patient not taking: Reported on 08/23/2014 10/07/13  Charm RingsErin J Honig, MD   BP 165/125 mmHg  Pulse 87  Temp(Src) 98.1 F (36.7 C)  Resp 16  Ht 5' (1.524 m)  Wt 340 lb (154.223 kg)  BMI 66.40 kg/m2  SpO2 95% Physical Exam  Constitutional: She is oriented to person, place, and time. She appears well-developed and well-nourished. No distress.  HENT:  Head: Normocephalic and atraumatic.  Hirsuit. Just to the lateral edge of the chin her there is a palpable small cutaneous lesion with no superficial erythema, drainage, induration.  Eyes: Conjunctivae and EOM are normal.  Cardiovascular: Normal rate and regular rhythm.   Pulmonary/Chest: No stridor. She has decreased breath  sounds.  Abdominal: She exhibits no distension.  Musculoskeletal: She exhibits no edema.  Neurological: She is alert and oriented to person, place, and time. No cranial nerve deficit.  Skin: Skin is warm and dry.  Psychiatric: She has a normal mood and affect.  Nursing note and vitals reviewed.   ED Course  Procedures (including critical care time) Labs Review Labs Reviewed  COMPREHENSIVE METABOLIC PANEL - Abnormal; Notable for the following:    Glucose, Bld 107 (*)    Albumin 3.2 (*)    Total Bilirubin <0.2 (*)    GFR calc non Af Amer 79 (*)    All other components within normal limits  CBC WITH DIFFERENTIAL - Abnormal; Notable for the following:    WBC 11.4 (*)    RBC 5.38 (*)    RDW 15.6 (*)    Lymphs Abs 4.3 (*)    All other components within normal limits  I-STAT BETA HCG BLOOD, ED ([facility], [facility], [facility] ONLY)    Imaging Review Dg Chest 2 View  08/23/2014   CLINICAL DATA:  Acute shortness of breath today for the last 2 hr. Cough and congestion for 2 days. Right-sided chest pain for 2 weeks. History of smoking and hypertension.  EXAM: CHEST  2 VIEW  COMPARISON:  None.  FINDINGS: The heart size and mediastinal contours are within normal limits. Both lungs are clear. No pleural effusion or pneumothorax. The visualized skeletal structures are unremarkable.  IMPRESSION: No active cardiopulmonary disease.   Electronically Signed   By: Amie Portlandavid  Ormond M.D.   On: 08/23/2014 21:32    Reviewed the x-ray and CT images myself, agree with the interpretation.   EKG Interpretation   Date/Time:  Saturday August 23 2014 22:06:47 EST Ventricular Rate:  87 PR Interval:  159 QRS Duration: 77 QT Interval:  342 QTC Calculation: 411 R Axis:   -13 Text Interpretation:  Sinus rhythm Probable left atrial enlargement  Borderline repolarization abnormality Borderline ST elevation, lateral  leads Sinus rhythm ST-t wave abnormality Artifact Abnormal ekg Confirmed  by Gerhard MunchLOCKWOOD, Nidia Grogan  MD ([id number]) on  08/23/2014 11:36:45 PM     Pulse ox 95% room air oral   Cardiac 85 sinus rhythm normal  On repeat exam the patient is in no distress. He and family are aware of all results, including nodules  He will f/u w PMD in 2d  MDM   Final diagnoses:  SOB (shortness of breath)    This patient with a history of asthma, and current testosterone use, and current smoking now presents with right-sided chest pain, dyspnea, pleuritic discomfort. Patient is moderate risk for PE, had CT scan performed.  This was unremarkable.  Patient's breathing improved her with steroids, albuterol.  With no evidence for pneumonia, PE, ACS, patient was discharged in stable condition.    Gerhard Munchobert Naithen Rivenburg, MD 08/24/14 [phone]  Gerhard Munch, MD 08/24/14 [phone]

## 2014-08-23 NOTE — ED Notes (Signed)
Patient transported to CT 

## 2014-08-23 NOTE — Progress Notes (Signed)
Patient has mild congestion.  Complains of SOB with a 40 pack year history.  No audible wheezing heard through auscultation.  RT will continue to monitor.

## 2014-08-24 MED ORDER — ALBUTEROL SULFATE HFA 108 (90 BASE) MCG/ACT IN AERS
2.0000 | INHALATION_SPRAY | Freq: Four times a day (QID) | RESPIRATORY_TRACT | Status: DC
  Administered 2014-08-24: 2 via RESPIRATORY_TRACT
  Filled 2014-08-24: qty 6.7

## 2014-08-24 MED ORDER — PREDNISONE 20 MG PO TABS: 60.0000 mg | ORAL_TABLET | Freq: Every day | ORAL | Status: AC

## 2014-08-24 NOTE — Discharge Instructions (Signed)
As discussed, it is important that you follow up as soon as possible with your physician for continued management of your condition. ° °If you develop any new, or concerning changes in your condition, please return to the emergency department immediately. ° °

## 2014-08-24 NOTE — ED Notes (Signed)
Patient is alert and orientedx4.  Patient was explained discharge instructions and they understood them with no questions.   

## 2014-08-29 ENCOUNTER — Encounter: Payer: Self-pay | Admitting: Family Medicine

## 2014-08-29 ENCOUNTER — Ambulatory Visit (INDEPENDENT_AMBULATORY_CARE_PROVIDER_SITE_OTHER): Payer: Medicaid Other | Admitting: Family Medicine

## 2014-08-29 VITALS — BP 164/102 | HR 89 | Temp 98.3°F | Wt 340.0 lb

## 2014-08-29 DIAGNOSIS — M25562 Pain in left knee: Secondary | ICD-10-CM

## 2014-08-29 DIAGNOSIS — G43701 Chronic migraine without aura, not intractable, with status migrainosus: Secondary | ICD-10-CM

## 2014-08-29 DIAGNOSIS — G4733 Obstructive sleep apnea (adult) (pediatric): Secondary | ICD-10-CM

## 2014-08-29 DIAGNOSIS — I10 Essential (primary) hypertension: Secondary | ICD-10-CM

## 2014-08-29 DIAGNOSIS — M25561 Pain in right knee: Secondary | ICD-10-CM

## 2014-08-29 DIAGNOSIS — J4550 Severe persistent asthma, uncomplicated: Secondary | ICD-10-CM

## 2014-08-29 MED ORDER — AMLODIPINE BESYLATE 10 MG PO TABS: 10.0000 mg | ORAL_TABLET | Freq: Every day | ORAL | Status: DC

## 2014-08-29 MED ORDER — ALBUTEROL SULFATE (2.5 MG/3ML) 0.083% IN NEBU: 2.5000 mg | INHALATION_SOLUTION | Freq: Four times a day (QID) | RESPIRATORY_TRACT | Status: DC | PRN

## 2014-08-29 MED ORDER — TOPIRAMATE 25 MG PO TABS: 50.0000 mg | ORAL_TABLET | Freq: Every day | ORAL | Status: DC

## 2014-08-29 NOTE — Assessment & Plan Note (Signed)
OSA, in morbidly obese patient  Plan: 1. Start using CPAP nightly, encouraged compliance 2. Important to improve BP, reduce fatigue

## 2014-08-29 NOTE — Patient Instructions (Addendum)
Dear Rachael BlazerMaria Rogers, Thank you for coming in to clinic today.  1. For your Blood Pressure - prescribed new medicine, Amlodipine (Norvasc) take 1 tablet (10mg ) daily in morning, every day 2. For your Headache - prescribed Topamax (25mg  tablets) start taking 1 tablet at bedtime every night for 1 week. Starting week 2, take 2 tablets (50mg  total) at bedtime. 3. I will refer you to the Neurology Headache Clinic as well. Given your history 4. Start using your CPAP machine (try [employer] for distilled water) - wear it every night, it will significantly help your blood pressure  Schedule f/u in 1-3 months  If you have any other questions or concerns, please feel free to call the clinic to contact me. You may also schedule an earlier appointment if necessary.  However, if your symptoms get significantly worse, please go to the Emergency Department to seek immediate medical attention.  Saralyn PilarAlexander Karamalegos, DO [facility]

## 2014-08-29 NOTE — Progress Notes (Signed)
   Subjective:    Patient ID: Rachael Rogers, female    DOB: 1976/02/26, 38 y.o.   MRN: [id number]  HPI  CHRONIC HTN, uncontrolled: Reports - states that after last visit started Lisinopril-HCTZ but had to self-discontinue it after 1 week due to side-effects (similar symptoms that caused her to stop after same trial in 2014), symptoms with "rectal swelling", irritated stomach, diarrhea, symptoms started same day after taking medication (same reaction to Lisinopril-HCTZ in 2014) - Has been without BP medication x 3 months - No regular exercise - Admits HAs (see below), some chronic blurry vision with occasional double vision - Denies any CP, SOB, loss of vision, focal weakness, numbness  Headache, Chronic, hx Migraine HA: - Chronic recurrent HAs, hx MRI "reported scarring in the brain", previously followed by [facility] with hearing loss in Left ear. Reported started about 2 weeks ago gradually getting worse. Sharp stabbing pain Left side of face and behind ear, similar distribution of prior HAs - Tried Motrin 800mg  2x without relief, Tylenol 3 (without relief), Tramadol not tolerated - No longer following up to Neurology - Admits increased blurry vision, nausea without vomiting, sweats, sensitivity to light - Denies fevers/chills, CP, lightheadedness, dizziness, LOC  PMH: - Morbid Obesity, Bilateral Knee Pain / Osteoarthritis (requesting paperwork for [facility] transport)  I have reviewed and updated the following as appropriate: allergies and current medications  Social Hx: - Active smoker, 0.3ppd, >7 years  Review of Systems  See above HPI    Objective:   Physical Exam  BP 209/133 mmHg  Pulse 89  Temp(Src) 98.3 F (36.8 C) (Oral)  Wt 340 lb (154.223 kg)   Repeat Manual BP: 164/102  Gen - morbidly obese, cooperative, NAD HEENT - NCAT, sinuses non-tender, b/l TM's normal, HA pain localized to pre and post auricular region without localized LAD or palpable tenderness. Fundoscopic exam  limited due to no dilatation. Neck - supple, non-tender Heart - RRR, no murmurs heard Lungs - CTAB. Diminished breath sounds due to body habitus. Normal work of breathing. Ext - bilateral +1 pitting LE edema, intact peripheral pulses Skin - warm, dry Neuro - awake, alert, oriented, grossly non-focal, CN-II-XII grossly intact, bilateral distal upper and lower ext str intact 5/5 grip / ankles, gait normal     Assessment & Plan:   See specific A&P problem list for details.

## 2014-08-29 NOTE — Assessment & Plan Note (Signed)
Chronic HAs recently worse, but seems consistent with chronic migraines (Left side only) gradual onset, associated nausea and photophobia - Unable to see MRI in records here, previously followed by [facility]  Plan: 1. Start Topamax 25mg  nightly, after 1 week inc to 50mg  nightly, refills 2. Referral to Neurology HA Clinic - previously established

## 2014-08-29 NOTE — Assessment & Plan Note (Signed)
Chronic OA bilateral knee pain  Plan: 1. Completed paperwork for pt to have permanent [facility] transport

## 2014-08-29 NOTE — Assessment & Plan Note (Signed)
Uncontrolled, significantly elevated >200/100, repeat manual down to 160/100 - Self discontinued HCTZ-Lisinopril repeat trial - HAs recently worse, but seems consistent with chronic migraines (Left side only) gradual onset, associated nausea and photophobia. No focal neurological symptoms.  Plan: 1. Start Amlodipine 10mg  daily, discontinue Lisinopril-HCTZ 2. Lifestyle mods - weight loss, smoking cessation, exercise 3. Advised to start wearing CPAP for OSA 4. Check BP at drugstore occasionally or home if able 5. Discussed likelihood of starting additional anti-HTN meds in future (consider trial only on HCTZ, if possible allergy to Lisinopril) 6. RTC 1-3 months re-check BP

## 2014-08-29 NOTE — Assessment & Plan Note (Signed)
Persistent Asthma in setting of obesity, OSA with multifactorial SOB - inadequate response to Albuterol nebulizer, previously rx'd  Plan: 1. Rx Nebulizer machine (for albuterol solution) - given neb machine in clinic, provided medicaid ins card and information, paperwork completed and faxed

## 2014-09-23 IMAGING — CR DG KNEE STANDING AP BILAT
1 series · 1 of 1 positions shown · non-contrast
Comparison: None.

CLINICAL DATA: Chronic bilateral knee pain.

BILATERAL KNEES STANDING - 1 VIEW

[w knees ap bilat]
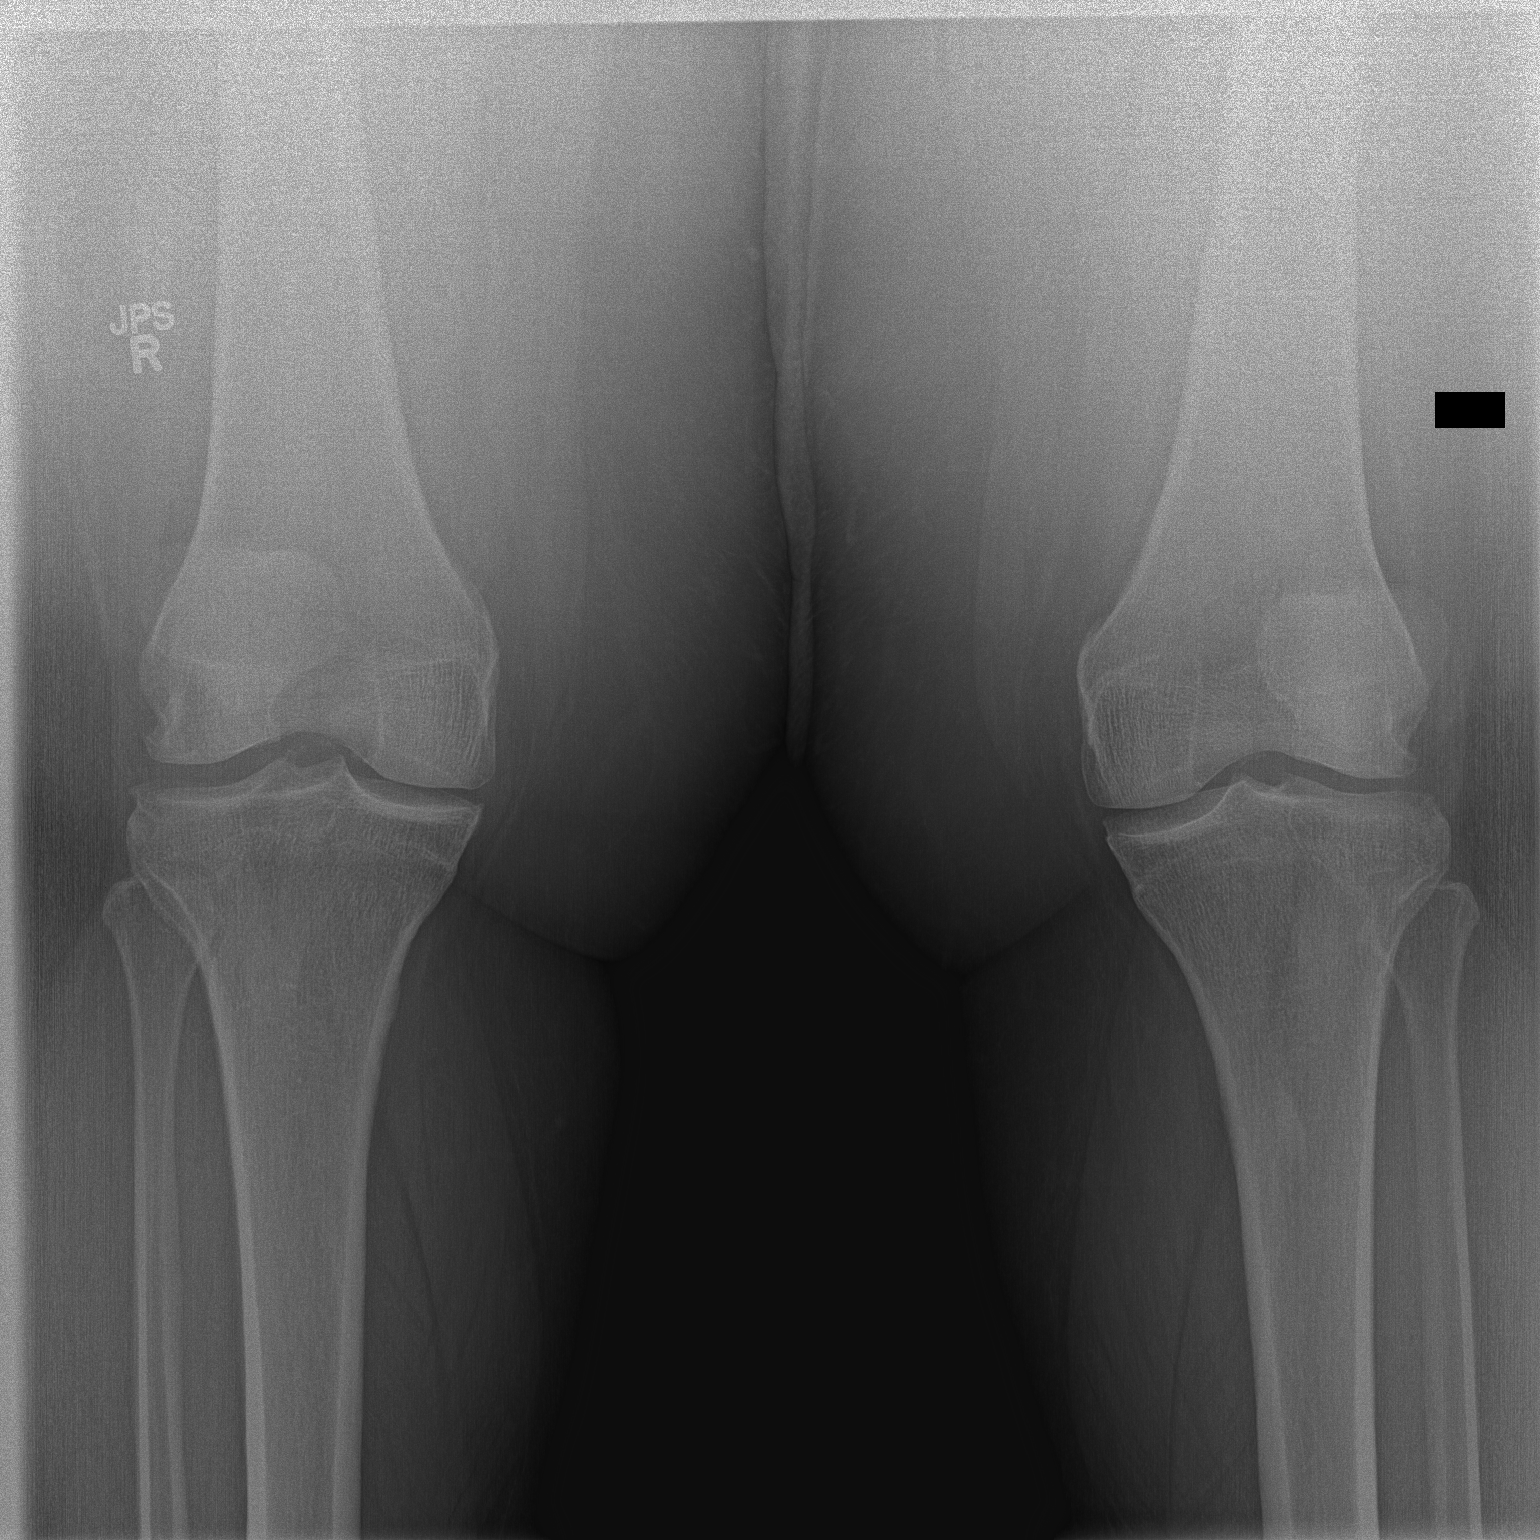

[1 of 1 positions shown; findings below may reference images not displayed]

FINDINGS: Joint spaces appear preserved.  Very small osteophytes
are present about the lateral compartment of the right knee.  No
focal bony lesion is identified.  No fracture.
IMPRESSION: Minimal degenerative change lateral compartment right knee.  The
examination is otherwise negative.

## 2014-09-24 ENCOUNTER — Institutional Professional Consult (permissible substitution): Payer: Self-pay | Admitting: Neurology

## 2014-10-02 ENCOUNTER — Encounter: Payer: Self-pay | Admitting: Neurology

## 2014-10-20 ENCOUNTER — Other Ambulatory Visit: Payer: Self-pay | Admitting: *Deleted

## 2014-10-20 DIAGNOSIS — R1013 Epigastric pain: Secondary | ICD-10-CM

## 2014-10-21 MED ORDER — OMEPRAZOLE 40 MG PO CPDR: 40.0000 mg | DELAYED_RELEASE_CAPSULE | Freq: Every day | ORAL | Status: DC

## 2014-12-29 ENCOUNTER — Encounter: Payer: Self-pay | Admitting: Family Medicine

## 2014-12-29 ENCOUNTER — Ambulatory Visit (INDEPENDENT_AMBULATORY_CARE_PROVIDER_SITE_OTHER): Payer: Medicaid Other | Admitting: Family Medicine

## 2014-12-29 VITALS — BP 165/90 | HR 79 | Temp 98.4°F | Wt 349.4 lb

## 2014-12-29 DIAGNOSIS — I10 Essential (primary) hypertension: Secondary | ICD-10-CM

## 2014-12-29 DIAGNOSIS — G43701 Chronic migraine without aura, not intractable, with status migrainosus: Secondary | ICD-10-CM

## 2014-12-29 DIAGNOSIS — G44219 Episodic tension-type headache, not intractable: Secondary | ICD-10-CM

## 2014-12-29 DIAGNOSIS — R350 Frequency of micturition: Secondary | ICD-10-CM

## 2014-12-29 LAB — GLUCOSE, CAPILLARY: Glucose-Capillary: 91 mg/dL (ref 70–99)

## 2014-12-29 MED ORDER — HYDROCHLOROTHIAZIDE 12.5 MG PO TABS: 12.5000 mg | ORAL_TABLET | Freq: Every day | ORAL | Status: DC

## 2014-12-29 MED ORDER — KETOROLAC TROMETHAMINE 30 MG/ML IJ SOLN
30.0000 mg | Freq: Once | INTRAMUSCULAR | Status: AC
  Administered 2014-12-29: 30 mg via INTRAMUSCULAR

## 2014-12-29 MED ORDER — SUMATRIPTAN SUCCINATE 6 MG/0.5ML ~~LOC~~ SOLN
6.0000 mg | Freq: Once | SUBCUTANEOUS | Status: AC
  Administered 2014-12-29: 6 mg via SUBCUTANEOUS

## 2014-12-29 MED ORDER — KETOROLAC TROMETHAMINE 30 MG/ML IM SOLN: 30.0000 mg | Freq: Once | INTRAMUSCULAR | Status: DC

## 2014-12-29 NOTE — Patient Instructions (Signed)
Thank you for coming in, today!  I think most of your symptoms are due to migraines. We will give you two shots, today. One is toradol (a similar medicine to Motrin), a good pain medication. The other is sumatriptan (brand name Imitrex) that can help with migraine pain. Sumatriptan, if it helps, is something we can consider prescribing as an oral medicine, once your blood pressure is under better control.  I want you to start HCTZ, a blood pressure medicine. This is a once-a-day medicine. You should continue to take the amlodipine you already have. Some of your leg swelling could be due to the amlodipine.  Dr. Kirtland BouchardK might want to adjust some medications more. Come back to see him in a week or two to see how the HCTZ works, and to see what else needs to be done for your headaches.  Please feel free to call with any questions or concerns at any time, at [phone]. --Dr. Casper HarrisonStreet

## 2014-12-29 NOTE — Progress Notes (Signed)
   Subjective:    Patient ID: Rachael Rogers, female    DOB: 09-21-1976, 39 y.o.   MRN: [id number]  HPI: [facility] for headache, dizziness, and LE swelling with blurred vision for 4-5 days. Symptoms started gradually last Thursday with leg / ankle swelling. She takes Motrin 800 mg, usually every day for the past few days especially but usually only once or twice per day. She states she was told not to take Tylenol as it "could upset her stomach." Her headache is typical of her migraines, mostly on the left side, throbbing / sharp, with severe photophobia. She has been drinking "lots of cold water" to try to help with the headache, which hasn't been helping (100+ oz per day).  Pt reports compliance with amlodipine 10 mg which she takes at night; she reports it makes her feel "out of whack," a little dizzy. She has not eaten or taken medications, today. Of note, pt is transgender (female to female) and is taking testosterone. She reports occasionally "craving sweet foods" and has some increased urinary frequency, though admits this may be due to how much water she has been drinking.  Review of Systems: As above. She describes sweating, nausea but no vomiting. She has no chest pain or shortness of breath. She denies change in bowel / bladder habits. She denies paresthesias.     Objective:   Physical Exam BP 165/90 mmHg  Pulse 79  Temp(Src) 98.4 F (36.9 C) (Oral)  Wt 349 lb 6.4 oz (158.487 kg) Gen: adult female in NAD though does appear uncomfortable HEENT: /AT, EOMI, PERRLA, MMM, TM's clear bilaterally  Nasal and posterior oropharyngeal mucosae clear  Marked female-pattern facial hair noted Cardio: RRR, no murmur appreciated Pulm: CTAB, no wheezes, normal WOB Abd: obese limiting exam, BS+, soft, nontender Ext: 1-2+ pitting edema of ankles up to ~5-6 cm above ankles, symmetric bilaterally; warm / well-perfused otherwise Neuro: alert, oriented, no gross focal deficits noted;  strength / sensation intact throughout  Fasting CBG: 91     Assessment & Plan:  39yo female with multifactorial headache, blurred vision, and ankle swelling, likely related to poorly-controlled HTN and chronic migraine - toradol 30 mg IM and Imitrex 6 mg subQ given in clinic today - could consider Imitrex as a long-term PO medication but would prefer to control BP better, first - continue amlodipine for now, though may need to consider alternative agent due to ankle swelling (?direct side effect) - new Rx for HCTZ 12.5 mg daily (pt previously very poorly tolerant of lisinopril with a variety of odd reported symptoms) - fasting CBG normal so doubt frank DM, but will defer to PCP for further eval as needed - f/u with PCP Dr. Althea CharonKaramalegos in the next 1-2 weeks to re-eval BP - could consider propranolol for dual anti-HTN effect and prophylaxis against migraines  Note FYI to Dr. Noah CharonKaramalegos  Park Beck M Tanyia Grabbe, MD PGY-3, [facility] 12/29/2014, 3:03 PM

## 2015-01-16 ENCOUNTER — Ambulatory Visit: Payer: Medicaid Other | Admitting: Family Medicine

## 2015-03-19 ENCOUNTER — Encounter: Payer: Self-pay | Admitting: Family Medicine

## 2015-03-19 ENCOUNTER — Ambulatory Visit (INDEPENDENT_AMBULATORY_CARE_PROVIDER_SITE_OTHER): Payer: Medicaid Other | Admitting: Family Medicine

## 2015-03-19 VITALS — BP 156/100 | HR 72 | Temp 98.4°F | Wt 343.0 lb

## 2015-03-19 DIAGNOSIS — K439 Ventral hernia without obstruction or gangrene: Secondary | ICD-10-CM | POA: Diagnosis not present

## 2015-03-19 DIAGNOSIS — R1013 Epigastric pain: Secondary | ICD-10-CM | POA: Diagnosis not present

## 2015-03-19 MED ORDER — DOCUSATE SODIUM 100 MG PO CAPS: 100.0000 mg | ORAL_CAPSULE | Freq: Two times a day (BID) | ORAL | Status: DC

## 2015-03-19 MED ORDER — ROXICET 5-325 MG PO TABS: 1.0000 | ORAL_TABLET | Freq: Three times a day (TID) | ORAL | Status: DC | PRN

## 2015-03-19 NOTE — Patient Instructions (Signed)
Thank you for coming to the clinic today. It was nice seeing you.  For your hernia pain, we will be referring you to surgery today. We will also give you a short course of pain medications. These medications will not work long term. The only long term fix is to have surgery.   We will also start you on a stool softener while taking these medications.

## 2015-03-19 NOTE — Assessment & Plan Note (Addendum)
Patient with significant hernia pain. No evidence for incarceration or bowel obstruction. Unclear if hernia is enlarging - appears to be 8-9cm in diameter on physical exam. Will send in referral to general surgery for definitive management. Gave a short course of percocet until he can be evaluated by surgery - explained to patient that this would be only a short term medication until he is evaluated by surgery. Patient understood. Also gave prescription for colace.

## 2015-03-19 NOTE — Progress Notes (Signed)
Rachael Rogers is a 39 y.o. female who presents to the [facility] today for same day appointment with chief complaint of abdominal pain. Her concerns today include:  HPI:  Abdominal Pain Patient diagnosed with umbilical hernia 10 months ago. Reports significant pain in the area of the hernia that has worsened over the past few weeks. Currently rated as 8/10. Described as sharp and constant. Some nausea, with no vomiting. Has had regular bowel movements every day. Has tried tylenol#3 and motrin without significant relief.   ROS: As per HPI, otherwise all systems reviewed and are negative.  Past Medical History - Reviewed and updated Patient Active Problem List   Diagnosis Date Noted  . Sore throat 10/21/2013  . Abdominal pain, epigastric 10/07/2013  . Ulnar neuropathy 05/27/2013  . Bilateral knee pain 04/24/2013  . Ganglion of left wrist 04/24/2013  . Migraine headache 02/07/2012  . Medication management 12/27/2011  . Obesity, Class III, BMI 40-49.9 (morbid obesity) 12/27/2011  . Hypertension 10/27/2011  . Tobacco abuse 10/27/2011  . Rectal bleeding 10/27/2011  . Obstructive sleep apnea 10/27/2011  . Asthma, severe persistent, poorly-controlled 10/27/2011  . Gender identity disorder 10/27/2011  . Irregular menses 10/27/2011    Medications- reviewed and updated Current Outpatient Prescriptions  Medication Sig Dispense Refill  . albuterol (PROVENTIL) (2.5 MG/3ML) 0.083% nebulizer solution Take 3 mLs (2.5 mg total) by nebulization every 6 (six) hours as needed for wheezing. 150 mL 1  . amLODipine (NORVASC) 10 MG tablet Take 1 tablet (10 mg total) by mouth daily. 90 tablet 3  . docusate sodium (COLACE) 100 MG capsule Take 1 capsule (100 mg total) by mouth 2 (two) times daily. 10 capsule 0  . hydrochlorothiazide (HYDRODIURIL) 12.5 MG tablet Take 1 tablet (12.5 mg total) by mouth daily. 90 tablet 0  . omeprazole (PRILOSEC) 40 MG capsule Take 1 capsule (40 mg total) by mouth daily. 30 capsule  0  . ROXICET 5-325 MG per tablet Take 1 tablet by mouth every 8 (eight) hours as needed for severe pain. 30 tablet 0  . testosterone cypionate (DEPOTESTOTERONE CYPIONATE) 200 MG/ML injection Inject 200 mg into the muscle every 14 (fourteen) days.  0   No current facility-administered medications for this visit.    Objective: Physical Exam: BP 156/100 mmHg  Pulse 72  Temp(Src) 98.4 F (36.9 C) (Oral)  Wt 343 lb (155.584 kg)  Gen: Morbidly obese man in NAD sitting on exam table CV: RRR with no murmurs appreciated Lungs: NWOB, CTAB with no crackles, wheezes, or rhonchi Abdomen: Exam limited due to morbidly obesity. +BS. 8-9cm mass noted in epigastric area, tender to palpation, non-reducible. Ext: no edema Skin: warm, dry Neuro: grossly normal, moves all extremities  A/P: See problem list  Abdominal pain, epigastric Patient with significant hernia pain. No evidence for incarceration or bowel obstruction. Unclear if hernia is enlarging - appears to be 8-9cm in diameter on physical exam. Will send in referral to general surgery for definitive management. Gave a short course of percocet until he can be evaluated by surgery - explained to patient that this would be only a short term medication until he is evaluated by surgery. Patient understood. Also gave prescription for colace.      Orders Placed This Encounter  Procedures  . Ambulatory referral to General Surgery    Referral Priority:  Routine    Referral Type:  Surgical    Referral Reason:  Specialty Services Required    Requested Specialty:  General Surgery  Number of Visits Requested:  1    Meds ordered this encounter  Medications  . ROXICET 5-325 MG per tablet    Sig: Take 1 tablet by mouth every 8 (eight) hours as needed for severe pain.    Dispense:  30 tablet    Refill:  0  . docusate sodium (COLACE) 100 MG capsule    Sig: Take 1 capsule (100 mg total) by mouth 2 (two) times daily.    Dispense:  10 capsule     Refill:  0     Caleb M. Jimmey RalphParker, MD [facility] Resident PGY-1 03/19/2015 11:12 AM

## 2015-04-06 ENCOUNTER — Other Ambulatory Visit: Payer: Self-pay | Admitting: Surgery

## 2015-04-06 NOTE — H&P (Signed)
Rachael Rogers 04/06/2015 10:17 AM Location: [facility] Patient #: [id number] DOB: [birth year] Single / Language: [place] / Race: Black or African American Female  History of Present Illness Rachael Sportsman MD; 04/06/2015 11:18 AM) Patient words: umb hernia.  The patient is a 39 year old female who presents with an umbilical hernia. Patient sent by her primary care physician, Dr. [name], for concern of abdominal pain and umbilical ventral wall hernia. Morbidly obese genetic female. Identifies as a female. Followed by endocrinology at [facility] with testosterone shots. Numerous health issues. HTN, OSA. Plugged into [facility]. Hypertension under better control now. Has had an umbilical hernia for a while. However having increasing pain and discomfort x 2 years. Umbilical radiating to epigastric region. He wonders if she has IBS or an ulcer. Some intermittent nausea and vomiting. Concerned the hernia could be the source. Consultation requested to consider surgery. Patient does smoke. Less than a pack a day. He is morbidly obese. Has lost about 20 pounds intentionally for the past few months. Can only walk a few blocks due to joint and knee pain.Marland Kitchen Heart attack or strokes. Sharp chest pains. Does get nausea heartburn and reflux. Tums help. Was given omeprazole to try but caused severe constipation so weaned off. Occasionally uses Colace. Struggles with occasional loose stools and irregular bowels. No history of endoscopy before. Never seen a cardiologist nor a gastroenterologist.   Problem List/Past Medical Rachael Sportsman, MD; 04/06/2015 11:19 AM) GENDER IDENTITY DISORDER (302.6  F64.9)  Other Problems Rachael Sportsman, MD; 04/06/2015 11:19 AM) Arthritis Asthma Back Pain Heart murmur Hemorrhoids Sleep Apnea  Past Surgical History Rachael Rogers, CMA; 04/06/2015 10:17 AM) Appendectomy  Allergies ([name], CMA; 04/06/2015 10:18  AM) Theophylline *ANTIASTHMATIC AND BRONCHODILATOR AGENTS*  Medication History ([name], CMA; 04/06/2015 10:20 AM) Albuterol Sulfate ((2.5 MG/3ML)0.083% Nebulized Soln, Inhalation) Active. AmLODIPine Besylate (10MG  Tablet, Oral) Active. Colace (100MG  Capsule, Oral) Active. Omeprazole (40MG  Capsule DR, Oral) Active. Roxicet (5-325MG  Tablet, Oral) Active. Testosterone Cypionate (200MG /ML Solution, Intramuscular) Active. Medications Reconciled  Social History Rachael Rogers, CMA; 04/06/2015 10:17 AM) Alcohol use Moderate alcohol use. Caffeine use Coffee, Tea. Tobacco use Current every day smoker.  Family History Rachael Rogers, CMA; 04/06/2015 10:17 AM) Alcohol Abuse Father. Anesthetic complications Sister. Arthritis Father, Mother. Depression Brother, Father. Heart Disease Father, Mother. Heart disease in female family member before age 59 Hypertension Father, Mother.  Review of Systems [name] Bynum CMA; 04/06/2015 10:17 AM) General Present- Appetite Loss and Fatigue. Not Present- Chills, Fever, Night Sweats, Weight Gain and Weight Loss. Skin Present- Rash. Not Present- Change in Wart/Mole, Dryness, Hives, Jaundice, New Lesions, Non-Healing Wounds and Ulcer. HEENT Present- Hearing Loss, Ringing in the Ears and Wears glasses/contact lenses. Not Present- Earache, Hoarseness, Nose Bleed, Oral Ulcers, Seasonal Allergies, Sinus Pain, Sore Throat, Visual Disturbances and Yellow Eyes. Respiratory Present- Difficulty Breathing and Wheezing. Not Present- Bloody sputum, Chronic Cough and Snoring. Cardiovascular Present- Shortness of Breath. Not Present- Chest Pain, Difficulty Breathing Lying Down, Leg Cramps, Palpitations, Rapid Heart Rate and Swelling of Extremities. Gastrointestinal Present- Abdominal Pain, Change in Bowel Habits, Constipation, Excessive gas and Rectal Pain. Not Present- Bloating, Bloody Stool, Chronic diarrhea, Difficulty Swallowing, Gets full quickly at meals,  Hemorrhoids, Indigestion, Nausea and Vomiting. Musculoskeletal Present- Back Pain, Joint Pain, Muscle Pain and Swelling of Extremities. Not Present- Joint Stiffness and Muscle Weakness. Neurological Present- Headaches, Numbness, Tingling and Trouble walking. Not Present- Decreased Memory, Fainting, Seizures, Tremor and Weakness. Psychiatric Present- Change in Sleep  Pattern. Not Present- Anxiety, Bipolar, Depression, Fearful and Frequent crying. Endocrine Present- Hot flashes. Not Present- Cold Intolerance, Excessive Hunger, Hair Changes, Heat Intolerance and New Diabetes.   Vitals ([name] CMA; 04/06/2015 10:18 AM) 04/06/2015 10:17 AM Weight: 328.8 lb Height: 60in Body Surface Area: 2.51 m Body Mass Index: 64.21 kg/m Pulse: 82 (Regular)  BP: 138/86 (Sitting, Left Arm, Standard)    Physical Exam Rachael Sportsman MD; 04/06/2015 11:08 AM) General Mental Status-Alert. General Appearance-Not in acute distress, Not Sickly. Orientation-Oriented X3. Hydration-Well hydrated. Voice-Normal.  Integumentary Global Assessment Upon inspection and palpation of skin surfaces of the - Axillae: non-tender, no inflammation or ulceration, no drainage. and Distribution of scalp and body hair is normal. General Characteristics Temperature - normal warmth is noted. Note: Facial and body hair consistent with testosterone shots   Head and Neck Head-normocephalic, atraumatic with no lesions or palpable masses. Face Global Assessment - atraumatic, no absence of expression. Neck Global Assessment - no abnormal movements, no bruit auscultated on the right, no bruit auscultated on the left, no decreased range of motion, non-tender. Trachea-midline. Thyroid Gland Characteristics - non-tender.  Eye Eyeball - Left-Extraocular movements intact, No Nystagmus. Eyeball - Right-Extraocular movements intact, No Nystagmus. Cornea - Left-No Hazy. Cornea - Right-No  Hazy. Sclera/Conjunctiva - Left-No scleral icterus, No Discharge. Sclera/Conjunctiva - Right-No scleral icterus, No Discharge. Pupil - Left-Direct reaction to light normal. Pupil - Right-Direct reaction to light normal.  ENMT Ears Pinna - Left - no drainage observed, no generalized tenderness observed. Right - no drainage observed, no generalized tenderness observed. Nose and Sinuses External Inspection of the Nose - no destructive lesion observed. Inspection of the nares - Left - quiet respiration. Right - quiet respiration. Mouth and Throat Lips - Upper Lip - no fissures observed, no pallor noted. Lower Lip - no fissures observed, no pallor noted. Nasopharynx - no discharge present. Oral Cavity/Oropharynx - Tongue - no dryness observed. Oral Mucosa - no cyanosis observed. Hypopharynx - no evidence of airway distress observed.  Chest and Lung Exam Inspection Movements - Normal and Symmetrical. Accessory muscles - No use of accessory muscles in breathing. Palpation Palpation of the chest reveals - Non-tender. Auscultation Breath sounds - Normal and Clear.  Cardiovascular Auscultation Rhythm - Regular. Murmurs & Other Heart Sounds - Auscultation of the heart reveals - No Murmurs and No Systolic Clicks.  Abdomen Inspection Inspection of the abdomen reveals - No Visible peristalsis and No Abnormal pulsations. Umbilicus - No Bleeding, No Urine drainage. Palpation/Percussion Palpation and Percussion of the abdomen reveal - Soft, Non Tender, No Rebound tenderness, No Rigidity (guarding) and No Cutaneous hyperesthesia. Note: Morbidly obese. Firmness and supraumbilical region suspicious for incarcerated supraumbilical ventral hernia. Sensitivity and lump at umbilical region.   Female Genitourinary Sexual Maturity Tanner 5 - Adult hair pattern. Note: No vaginal bleeding nor discharge. No inguinal hernias. No maceration or breakdown under panniculus   Peripheral  Vascular Upper Extremity Inspection - Left - No Cyanotic nailbeds, Not Ischemic. Right - No Cyanotic nailbeds, Not Ischemic.  Neurologic Neurologic evaluation reveals -normal attention span and ability to concentrate, able to name objects and repeat phrases. Appropriate fund of knowledge , normal sensation and normal coordination. Mental Status Affect - not angry, not paranoid. Cranial Nerves-Normal Bilaterally. Gait-Normal.  Neuropsychiatric Mental status exam performed with findings of-able to articulate well with normal speech/language, rate, volume and coherence, thought content normal with ability to perform basic computations and apply abstract reasoning and no evidence of hallucinations, delusions, obsessions or homicidal/suicidal  ideation.  Musculoskeletal Global Assessment Spine, Ribs and Pelvis - no instability, subluxation or laxity. Right Upper Extremity - no instability, subluxation or laxity.  Lymphatic Head & Neck  General Head & Neck Lymphatics: Bilateral - Description - No Localized lymphadenopathy. Axillary  General Axillary Region: Bilateral - Description - No Localized lymphadenopathy. Femoral & Inguinal  Generalized Femoral & Inguinal Lymphatics: Left - Description - No Localized lymphadenopathy. Right - Description - No Localized lymphadenopathy.    Assessment & Plan Rachael Sportsman MD; 04/06/2015 11:20 AM) Dorethea Clan VENTRAL HERNIA (552.20  K46.0) Impression: Fullness and sensitivity supraumbilically strongly suspicious for incarcerated ventral hernia with fat. Seen on CT scan last summer. Also umbilical hernia slightly inferior to it. I suspect there are 2 contiguous areas. Think the patient would benefit from surgical repair. Plan on a used a large sheet of mesh. Try to do this laparoscopically.  I did caution that risk of surgery is higher in a morbidly obese smoking person.  I strongly stressed to the patient and would be in the patient's  best interest to quit smoking. We'll consider this.  Also encouraged to lose weight. Heart he has lost 20 pounds intentionally. Hopefully will be able to continue to lose more.  Given issues of hypertension and smoking of morbid obesity and poor exercise tolerance, would like to get cardiac clearance to make sure were not missing anything. See if echocardiogram or other workup is needed. Current Plans  Schedule for Surgery Written instructions provided Pt Education - Pamphlet Given - Laparoscopic Hernia Repair: discussed with patient and provided information. Pt Education - [facility] Hernia Post-Op HCI (Mikhia Dusek): discussed with patient and provided information. Pt Education - [facility] Good Bowel Health (Rily Nickey) Pt Education - [facility] Pain Control (Lazarius Rivkin) Pt Education - [facility] Free Text Education/Instructions: discussed with patient and provided information. UMBILICAL HERNIA WITH OBSTRUCTION, WITHOUT GANGRENE (552.1  K42.0) GASTROESOPHAGEAL REFLUX DISEASE, ESOPHAGITIS PRESENCE NOT SPECIFIED (530.81  K21.9) Impression: I suspect a lot of the abdominal complaints of poor tolerance to acidic foods, nausea, heartburn, reflux is due to GERD / reflux disease.  No strong evidence of hiatal hernia.  Recommend discuss with primary care physician and strongly reconsider a different proton pump inhibitor since constipated with omeprazole. I did caution that repair of the hernia will not solve the GERD. Patient may need to consider gastroenterology evaluation medicine bone did not solve the problem and still struggles with issues.  Rachael Rogers, M.D., F.A.C.S. Gastrointestinal and Minimally Invasive Surgery [facility] [address] [place], [place] [address] [phone] Main / Paging

## 2015-04-24 ENCOUNTER — Telehealth: Payer: Self-pay | Admitting: Family Medicine

## 2015-04-24 NOTE — Telephone Encounter (Addendum)
Agree with more urgent follow-up if worsening over weekend, otherwise will f/u on Monday SDA.  Called patient back to review. Difficult to follow symptoms exactly over the phone, but does seem like bilateral lower leg / ankle swelling, pain, some "mosquito bite" looking pustules draining, has been using antibiotic and A&D ointment, elevation. Denies known fever, nausea / vomiting, redness or rash. Tolerating PO. Limited transportation unable to be seen sooner. Advised patient to seek more urgent treatment ED or Urgent Care if worsening symptoms, concern cellulitis, also with swelling concern DVT, may need to be examined over weekend. Patient understands.  Saralyn PilarAlexander Karamalegos, DO [facility], PGY-3

## 2015-04-24 NOTE — Telephone Encounter (Signed)
Spoke with patient regarding leg swelling.  Pt stated her legs have been very swollen for about 2-3 days now.  They also have bumps that are draining pus and itchy.  She has been keeping them clean and applying A&D ointment.  Pt stated her  legs are very painful.  Pt stopped taking her blood pressure medication over the holiday and she was also drinking alcohol.  Pt restarted her medication earlier this week.  Advised patient that she should be seen today, [facility] appts at [facility].  Patient is not able to get any transportation today; she has to call for [employer] at least three day in advance.  Appt scheduled for 04/27/15 at 9 AM with same day provider. Will forward to PCP.  Clovis PuMartin, Charmaine Placido L, RN

## 2015-04-24 NOTE — Telephone Encounter (Signed)
Pt called because she has high blood pressure and takes medication for this. The issue she having is that her legs are very swollen and so are her ankles. She also has some pus filled bumps in these areas. Please call to discuss her options. jw

## 2015-04-27 ENCOUNTER — Encounter: Payer: Self-pay | Admitting: Family Medicine

## 2015-04-27 ENCOUNTER — Ambulatory Visit (INDEPENDENT_AMBULATORY_CARE_PROVIDER_SITE_OTHER): Payer: Medicaid Other | Admitting: Family Medicine

## 2015-04-27 VITALS — BP 145/96 | HR 77 | Temp 98.3°F | Ht 61.0 in | Wt 329.1 lb

## 2015-04-27 DIAGNOSIS — W57XXXA Bitten or stung by nonvenomous insect and other nonvenomous arthropods, initial encounter: Principal | ICD-10-CM

## 2015-04-27 DIAGNOSIS — L089 Local infection of the skin and subcutaneous tissue, unspecified: Secondary | ICD-10-CM

## 2015-04-27 DIAGNOSIS — S80869A Insect bite (nonvenomous), unspecified lower leg, initial encounter: Secondary | ICD-10-CM | POA: Diagnosis present

## 2015-04-27 DIAGNOSIS — L989 Disorder of the skin and subcutaneous tissue, unspecified: Secondary | ICD-10-CM | POA: Insufficient documentation

## 2015-04-27 MED ORDER — DOXYCYCLINE HYCLATE 100 MG PO TABS: 100.0000 mg | ORAL_TABLET | Freq: Two times a day (BID) | ORAL | Status: DC

## 2015-04-27 MED ORDER — HYDROXYZINE HCL 10 MG PO TABS: 10.0000 mg | ORAL_TABLET | Freq: Three times a day (TID) | ORAL | Status: DC | PRN

## 2015-04-27 MED ORDER — CIPROFLOXACIN HCL 500 MG PO TABS
500.0000 mg | ORAL_TABLET | Freq: Two times a day (BID) | ORAL | Status: DC
Start: 2015-04-27 — End: 2015-05-26

## 2015-04-27 MED ORDER — ROXICET 5-325 MG PO TABS: 1.0000 | ORAL_TABLET | Freq: Three times a day (TID) | ORAL | Status: DC | PRN

## 2015-04-27 NOTE — Patient Instructions (Signed)
Dear Byrd HesselbachMaria, Thank you for coming in to clinic today.  1. It looks like infected flea bites, however we consider this a "folliculitis" infected skin spots, may be due to pool exposure. - We will cover both types of bacteria with 2 antibiotics - Take Doxycycline 100mg  (2 times daily) for 10 days - Take Ciprofloxacin 500mg  (2 times daily) for 10 days - For pain refilled Roxicet - For itching given rx Hydroxyzine - take as needed may help sleep, be careful with too much sedation - May try topical ice, anti-itch benadryl cream, can use ointment still if helping heal  Some important numbers from today's visit: BP - 150/100 > improved to 146/90  Please schedule a follow-up appointment in 2 weeks to follow-up infection  If you have any other questions or concerns, please feel free to call the clinic to contact me. You may also schedule an earlier appointment if necessary.  However, if your symptoms get significantly worse, please go to the Emergency Department to seek immediate medical attention.  Saralyn PilarAlexander Roselinda Bahena, DO [facility]

## 2015-04-27 NOTE — Assessment & Plan Note (Signed)
Consistent with extensive multiple infected insect bites (various stage healing) scattered across bilateral LE ankles to below knees without any extension to other body parts. Considered likely flea bites with superinfection + scratching, alternatively could be folliculitis with recent pool exposure. Concern for both MRSA skin flora (pustules) and GNR pseudomonas with pool folliculitis. - Afebrile, no extending cellulitis. Seems uncomplicated. Tolerating PO.  Plan: 1. Start Doxycyline  PO BID x 10 days 2. Start Cipro  PO BID x 10 days 3. Refilled Roxicet PRN pain #15 4. Rx Hydroxyzine PRN itching - if not responding to topicals 5. Symptom control with ice, soaks, rest, elevation for edema 6. RTC 1-2 weeks for re-evaluation infection. Return criteria given

## 2015-04-27 NOTE — Progress Notes (Signed)
   Subjective:    Patient ID: Rachael Rogers, female    DOB: Oct 11, 1976, 39 y.o.   MRN: [id number]  Patient presents for a same day appointment.  HPI  INFECTED INSECT BITES, PAIN, SWELLING of B/L LOWER LEGS: Reports symptoms started about 1 week ago. She stated symptoms started with "itching" around her left ankle, did have some worsened swelling in her lower ext (known history of chronic b/l LE edema with venous stasis and obesity), started on outside left ankle then gradual worsening in 24 hours. Prior to this she was swimming in pool. Identified multiple small "insect bites" that spread over her entire lower extremity ankle up to knee bilaterally (not on feet and not above knees, or on other parts of body). Over past week with worsening swelling, itching, bites with drainage of some pus. No transportation and unable to seek care sooner. - Tried A&D ointment and antibacterial ointment - Tried Roxicet for pain relief (previously rx for ventral hernia 03/2015). Not tried any anti-itch medicine. - Known exposure with fleas. Has cat and 2 dogs with fleas at home, sleep in same bed, currently being treated for fleas. No one else with similar rash - Admits to scratching and complains of persistent pain, constant burning and itching - Tolerating PO - Denies any fevers/chills, redness spreading, pain in other regions of body, numbness, tingling, weakness, nausea / vomiting   PMH transgender, continues to take testosterone  I have reviewed and updated the following as appropriate: allergies and current medications  Social Hx: - Limited transportation - Active smoker  Review of Systems  See above HPI    Objective:   Physical Exam  BP 145/96 mmHg  Pulse 77  Temp(Src) 98.3 F (36.8 C) (Oral)  Ht 5\' 1"  (1.549 m)  Wt 329 lb 1.6 oz (149.279 kg)  BMI 62.22 kg/m2  Gen - obese, well-appearing, mild discomfort with b/l legs, cooperative, NAD HEENT - MMM Heart - RRR, no murmurs heard Skin /  Ext - Bilateral lower ext ankles to knees with scattered diffuse small < 0.5cm round bite-like lesions various stages of healing, some darkened 1cm healed spots others superificial opening with mild localized erythema and minimal drainage of pus. Moderately generalized +TTP. No surrounding or extending erythema, calves symmetrical, no lesions / rash or bites on feet or between digits. No extension to other parts of body. Trace b/l edema within ankles and lower ext stable from previous. Peripheral pulses intact +2 b/l           Assessment & Plan:   See specific A&P problem list for details.

## 2015-04-29 ENCOUNTER — Other Ambulatory Visit: Payer: Self-pay | Admitting: *Deleted

## 2015-04-29 DIAGNOSIS — I10 Essential (primary) hypertension: Secondary | ICD-10-CM

## 2015-04-29 MED ORDER — HYDROCHLOROTHIAZIDE 12.5 MG PO TABS: 12.5000 mg | ORAL_TABLET | Freq: Every day | ORAL | Status: DC

## 2015-05-26 ENCOUNTER — Encounter: Payer: Self-pay | Admitting: Family Medicine

## 2015-05-26 ENCOUNTER — Ambulatory Visit (INDEPENDENT_AMBULATORY_CARE_PROVIDER_SITE_OTHER): Payer: Medicaid Other | Admitting: Family Medicine

## 2015-05-26 VITALS — BP 160/88 | Temp 98.2°F | Ht 61.0 in | Wt 337.7 lb

## 2015-05-26 DIAGNOSIS — W57XXXA Bitten or stung by nonvenomous insect and other nonvenomous arthropods, initial encounter: Secondary | ICD-10-CM

## 2015-05-26 DIAGNOSIS — S80869A Insect bite (nonvenomous), unspecified lower leg, initial encounter: Secondary | ICD-10-CM

## 2015-05-26 DIAGNOSIS — R600 Localized edema: Secondary | ICD-10-CM | POA: Diagnosis not present

## 2015-05-26 DIAGNOSIS — B373 Candidiasis of vulva and vagina: Secondary | ICD-10-CM

## 2015-05-26 DIAGNOSIS — L089 Local infection of the skin and subcutaneous tissue, unspecified: Secondary | ICD-10-CM | POA: Diagnosis not present

## 2015-05-26 DIAGNOSIS — B3731 Acute candidiasis of vulva and vagina: Secondary | ICD-10-CM

## 2015-05-26 DIAGNOSIS — S80869D Insect bite (nonvenomous), unspecified lower leg, subsequent encounter: Secondary | ICD-10-CM | POA: Diagnosis not present

## 2015-05-26 DIAGNOSIS — W57XXXD Bitten or stung by nonvenomous insect and other nonvenomous arthropods, subsequent encounter: Secondary | ICD-10-CM

## 2015-05-26 LAB — BASIC METABOLIC PANEL WITH GFR
BUN: 14 mg/dL (ref 7–25)
CALCIUM: 9.1 mg/dL (ref 8.6–10.2)
CHLORIDE: 104 mmol/L (ref 98–110)
CO2: 23 mmol/L (ref 20–31)
Creat: 0.99 mg/dL (ref 0.50–1.10)
GFR, Est African American: 82 mL/min (ref >=60)
GFR, Est Non African American: 72 mL/min (ref >=60)
GLUCOSE: 93 mg/dL (ref 65–99)
POTASSIUM: 4.3 mmol/L (ref 3.5–5.3)
Sodium: 139 mmol/L (ref 135–146)

## 2015-05-26 MED ORDER — PREDNISONE 20 MG PO TABS: ORAL_TABLET | ORAL | Status: DC

## 2015-05-26 MED ORDER — MUPIROCIN CALCIUM 2 % EX CREA: 1.0000 "application " | TOPICAL_CREAM | Freq: Two times a day (BID) | CUTANEOUS | Status: DC

## 2015-05-26 NOTE — Patient Instructions (Signed)
Dear Byrd Hesselbach, Thank you for coming in to clinic today.  1. Discussed possible bug bites / flea bites, and poor healing with swelling. - Start Mupirocin cream use 2 times daily for up to 2 weeks for relief - Will check blood work today and discuss with Cardiologist to see if will need further Cardiac testing with possible ECHO - Use Prednisone as prescribed - May try topical ice, anti-itch benadryl cream PRN  Some important numbers from today's visit:  Please schedule a follow-up appointment in 1 month to follow-up Leg Swelling.  If you have any other questions or concerns, please feel free to call the clinic to contact me. You may also schedule an earlier appointment if necessary.  However, if your symptoms get significantly worse, please go to the Emergency Department to seek immediate medical attention.  Saralyn Pilar, DO [facility]

## 2015-05-26 NOTE — Progress Notes (Signed)
   Subjective:    Patient ID: Rachael Rogers, female    DOB: 08-Jan-1976, 39 y.o.   MRN: [id number]  HPI  FOLLOW-UP INSECT BITES, PAIN, SWELLING of B/L LOWER LEGS: - Last seen for same complaint on 04/27/15 by me. Suspected insect bites on exposed lower ext bilateral concern for infection, considered flea bites (history of pets with possible fleas) but no other fam members with similar bites. Also pool exposure. Covered for MRSA/Pseudomonas with Doxycycline / Cipro PO x 10 day course. - Today reports symptoms essentially unchanged, without worsening but no significant improvement from antibiotic course previously. Complains of persistent lower ext pain, itching, and concerned about bilateral lower ext edema. Seems to not have any further draining pustules, but still scattered "bug bites". Denies new exposures or any further fleas at home, stated house was thoroughly cleaned, used bleach, and pets no longer in bedroom (bathed appropriately). No other fam members with similar bites. - Admits edema worse with heat, improve with elevation, worse at end of day, previously has not had this significant edema prior - Tolerating PO - Itching improved with wife's mupirocin cream rx (not improved with A&D, Neosporin), has used few times with improvement. Planning to try OTC benadryl cream. - Walking up to 2 blocks daily - Admits LE edema, multiple insect bites various healing - Additionally admits to chronic DOE at baseline without worsening - Denies any fevers/chills, redness of skin or focal abscess, drainage of pus or bleeding, numbness tingling, weakness, nausea / vomiting  CHRONIC HTN: Reports - checking BP at home regularly 100s to 120s. Today states nervous and in pain. Reports good compliance, took meds today. Tolerating well, w/o complaints. Lifestyle - walks 2 blocks daily, does not follow low salt diet Admits chronic DOE Denies active CP, dyspnea, HA, edema, dizziness / lightheadedness  Family  history: - +cardiovascular disease (father 83-64 years old with MI, mother 80s)  PMH transgender, continues to take testosterone  I have reviewed and updated the following as appropriate: allergies and current medications  Social Hx: - Limited transportation - Completed SCAT application today for patient with chronic limited mobility due to morbid obesity, multiple joint osteoarthritis with pain and DOE limiting ambulation - Active smoker  Review of Systems  See above HPI    Objective:   Physical Exam  BP 160/88 mmHg  Temp(Src) 98.2 F (36.8 C) (Oral)  Ht  (1.549 m)  Wt 337 lb 11.2 oz (153.18 kg)  BMI 63.84 kg/m2   Repeat Manual BP  Gen - morbidly obese, well-appearing, mild discomfort with b/l legs, cooperative, NAD HEENT - MMM Heart - RRR, no murmurs heard Skin / Ext - Mostly stable Bilateral lower ext ankles to knees with scattered diffuse small < 0.5cm round bite-like lesions various stages of healing, some darkened 1cm healed spots others superificial opening. Now with seems to be resolved drainage / purulence. Resolved mild erythema localized without extension. Persistent mild-mod +TTP. calves symmetrical, no lesions / rash or bites on feet or between digits. No extension to other parts of body. Trace to +1 pitting b/l edema within ankles and lower ext . Peripheral pulses intact +2 b/l Neuro - awake, alert, grossly non-focal, muscle str 5/5 bilateral ankle dorsiflexion, distal sensation light touch intact     Assessment & Plan:   See specific A&P problem list for details.

## 2015-05-27 ENCOUNTER — Encounter: Payer: Self-pay | Admitting: Family Medicine

## 2015-05-27 LAB — CBC
HCT: 47.6 % — ABNORMAL HIGH (ref 36.0–46.0)
HEMOGLOBIN: 15.4 g/dL — AB (ref 12.0–15.0)
MCH: 28.2 pg (ref 26.0–34.0)
MCHC: 32.4 g/dL (ref 30.0–36.0)
MCV: 87 fL (ref 78.0–100.0)
MPV: 10.5 fL (ref 8.6–12.4)
Platelets: 358 10*3/uL (ref 150–400)
RBC: 5.47 MIL/uL — ABNORMAL HIGH (ref 3.87–5.11)
RDW: 15.4 % (ref 11.5–15.5)
WBC: 9.6 10*3/uL (ref 4.0–10.5)

## 2015-05-27 LAB — RPR

## 2015-05-27 LAB — HIV ANTIBODY (ROUTINE TESTING W REFLEX): HIV 1&2 Ab, 4th Generation: NONREACTIVE

## 2015-05-27 MED ORDER — FLUCONAZOLE 150 MG PO TABS: 150.0000 mg | ORAL_TABLET | Freq: Once | ORAL | Status: DC

## 2015-05-27 NOTE — Assessment & Plan Note (Addendum)
Stable to unchanged. Bites seem perhaps increased in number slightly, but seems resolved any mild purulence or drainage. No erythema, no appearance of superinfection or cellulitis at this time. No systemic symptoms of infection. Still scratching with notable itching, edema, all seems related. Suspect arthropod / insect bites, initially thought fleas. Report seems to have treated / cleaned at home, however no other fam members with similar bites. Possible source still present at home, and perpetuating biting cycle. No other spread to other body sites. - Completed Doxy / Cipro x 10 days, no significant improvement  Plan: 1. Check CBC to see if WBC significantly elevated (will resume pursuit of possible infection), results with WBC 9.6, reassuring, unlikely significant active infection 2. Check HIV / RPR for rule out alternative atypical infections - results both non-reactive 3. Trial on Mupirocin 2% ointment (previously worked from [employer] rx) use BID 2-4 week trial 4. Start Prednisone taper  tabs - 3 tabs for 3 days, 2 for 3 days, 1 for 3 days approx 1 week to see if resolves itching, possible reaction to bites 5. May use OTC benadryl topical anti-itch, ice packs PRN 6. RTC 2-4 week re-eval

## 2015-05-27 NOTE — Assessment & Plan Note (Signed)
Suspected multifactorial with morbid obesity, recent hot weather, no adherence to low salt diet, reduced activity, seems acutely worse with recent presumed arthropod bites b/l lower legs. Chronic prior history of some swelling, but patient consistent reporting significant new / worsening. Consider alternative underlying etiology with CHF (less likely in 40 yr, however significant uncontrolled HTN, no history of MI and ischemic disease less likely, but significant family hx with MI/CAD parents age 58 to 62), does have chronic baseline DOE (again multifactorial with morbid obesity and deconditioning). No prior ECHO. - Reassurance with improved edema on elevation  Plan: 1. Check basic lab work-up with BMET - unremarkable, SCr and kidney function normal, electrolytes stable 2. Continue attempt to treat arthropod bites 3. Conservative therapy with elevation, can try support stockings, stay active, low salt diet, eventual wt loss 4. If still not improving, would certainly recommend pursuing further cardiac work-up with ECHO 5. Of note, already scheduled to see Cardiology for pre-op clearance for upcoming ventral hernia repair, to see Dr. Delton See on 8/22. Will contact cardiology to see if may benefit from ECHO testing at this time vs future follow-up

## 2015-05-27 NOTE — Addendum Note (Signed)
Addended by: Smitty Cords on: 05/27/2015 10:35 AM   Modules accepted: Orders

## 2015-05-28 NOTE — Progress Notes (Signed)
I was preceptor the day of this visit.   

## 2015-06-08 ENCOUNTER — Ambulatory Visit: Payer: Medicaid Other | Admitting: Cardiology

## 2015-06-11 ENCOUNTER — Ambulatory Visit (INDEPENDENT_AMBULATORY_CARE_PROVIDER_SITE_OTHER): Payer: Medicaid Other | Admitting: Cardiology

## 2015-06-11 ENCOUNTER — Encounter: Payer: Self-pay | Admitting: Cardiology

## 2015-06-11 VITALS — BP 110/70 | HR 87 | Ht 61.0 in | Wt 340.0 lb

## 2015-06-11 DIAGNOSIS — R600 Localized edema: Secondary | ICD-10-CM

## 2015-06-11 DIAGNOSIS — R9431 Abnormal electrocardiogram [ECG] [EKG]: Secondary | ICD-10-CM | POA: Diagnosis not present

## 2015-06-11 DIAGNOSIS — R0609 Other forms of dyspnea: Secondary | ICD-10-CM

## 2015-06-11 DIAGNOSIS — Z8249 Family history of ischemic heart disease and other diseases of the circulatory system: Secondary | ICD-10-CM

## 2015-06-11 MED ORDER — FUROSEMIDE 40 MG PO TABS: 40.0000 mg | ORAL_TABLET | Freq: Every day | ORAL | Status: DC

## 2015-06-11 NOTE — Progress Notes (Signed)
Patient ID: Rachael Rogers, female   DOB: 05-May-1976, 39 y.o.   MRN: [id number]      Cardiology Office Note   Date:  06/11/2015   ID:  Rachael Rogers, DOB 1975/12/15, MRN [id number]  PCP:  Saralyn Pilar, DO  Cardiologist:  Rachael Masson, MD   Chief complain: LE edema, DOE   History of Present Illness: Rachael Rogers is a 39 y.o. female who presents for evaluation of LE edema, DOE and prop evaluation for ventral hermia. She is a transgender female on testosterone therapy with h/o obesity, physical inactivity, hypertension and family h/o premature CAD. Murmur was told that she had a murmur as a child. She has been chronically short of breath on exertion, she would have to stop to walk one flight of stairs, walk a block or when shopping groceries.  This hasn't changed in the last few years. She has attributed it to obesity and asthma. She has recently lost 20 lbs. She developed LE edema, believed to be sec to cellulitis sec to flea bites. Le edema improved on hctz, but hasn't resolved. She denies orthopnea, PND, palpitations or syncope. No chest pain.  Past Medical History  Diagnosis Date  . Asthma   . Sleep apnea 2011  . Heart murmur 1990  . Hypertension   . Arthritis of knee     Past Surgical History  Procedure Laterality Date  . Appendectomy  1996   Current Outpatient Prescriptions  Medication Sig Dispense Refill  . albuterol (PROVENTIL) (2.5 MG/3ML) 0.083% nebulizer solution Take 3 mLs (2.5 mg total) by nebulization every 6 (six) hours as needed for wheezing. 150 mL 1  . amLODipine (NORVASC) 10 MG tablet Take 1 tablet (10 mg total) by mouth daily. 90 tablet 3  . docusate sodium (COLACE) 100 MG capsule Take 1 capsule (100 mg total) by mouth 2 (two) times daily. 10 capsule 0  . hydrochlorothiazide (HYDRODIURIL) 12.5 MG tablet Take 1 tablet (12.5 mg total) by mouth daily. 90 tablet 4  . omeprazole (PRILOSEC) 40 MG capsule Take 1 capsule (40 mg total) by mouth daily. 30  capsule 0  . testosterone cypionate (DEPOTESTOTERONE CYPIONATE) 200 MG/ML injection Inject 200 mg into the muscle every 14 (fourteen) days.  0   No current facility-administered medications for this visit.   Allergies:   Food and Theophyllines   Social History:  The patient  reports that she has been smoking Cigarettes.  She has a 7.5 pack-year smoking history. She has never used smokeless tobacco. She reports that she drinks alcohol. She reports that she uses illicit drugs (Marijuana).   Family History:  The patient's family history includes Asthma in her father; Heart failure in her mother; Hyperlipidemia in her father and mother; Hypertension in her father and mother; Stroke in her maternal grandmother.    ROS:  Please see the history of present illness.    All other systems are reviewed and negative.   PHYSICAL EXAM: VS:  BP 110/70 mmHg  Pulse 87  Ht  (1.549 m)  Wt 340 lb (154.223 kg)  BMI 64.28 kg/m2 , BMI Body mass index is 64.28 kg/(m^2). GEN: Well nourished, well developed, in no acute distress HEENT: normal Neck: no JVD, carotid bruits, or masses Cardiac: RRR; short systolic murmurmurmurs, rubs, or gallops,mild B/L edema, bite wounds, healed, pigmentations spots  Respiratory:  clear to auscultation bilaterally, normal work of breathing GI: soft, nontender, nondistended, + BS MS: no deformity or atrophy Skin: warm and dry, no  rash Neuro:  Strength and sensation are intact Psych: euthymic mood, full affect   EKG:  EKG : SR, possible LAE, LVH  Recent Labs: 08/23/2014: ALT 10 05/26/2015: BUN 14; Creat 0.99; Hemoglobin 15.4*; Platelets 358; Potassium 4.3; Sodium 139   Lipid Panel    Component Value Date/Time   CHOL 182 04/24/2013 1159   TRIG 93 04/24/2013 1159   HDL 38* 04/24/2013 1159   CHOLHDL 4.8 04/24/2013 1159   VLDL 19 04/24/2013 1159   LDLCALC 125* 04/24/2013 1159    Wt Readings from Last 3 Encounters:  06/11/15 340 lb (154.223 kg)  05/26/15 337 lb 11.2  oz (153.18 kg)  04/27/15 329 lb 1.6 oz (149.279 kg)    ECG: SR, possible LAE, otherwise normal ECG    ASSESSMENT AND PLAN:  1. LE edema, DOE - echo to evaluate syst and diast function, follow in 1 months, d/c HCTZ, start lasix 40 mg po daily, check BMP at that time  2. HTN - controlled  3. Preop evaluation - cleared if normal echo, no stress test necessary unless abnormal LVEF  4. Smoking - advised to stop  5. Obesity - she is exercising and loosing weight  Signed, Rachael Masson, MD  06/11/2015 2:44 PM    [facility] [address] [place], [place], [place]  [address] Phone: [phone]; Fax: [phone]

## 2015-06-11 NOTE — Patient Instructions (Signed)
Medication Instructions:   STOP HYDROCHLOROTHIAZIDE NOW  START LASIX 40 MG ONCE DAILY      Testing/Procedures:  Your physician has requested that you have an echocardiogram. Echocardiography is a painless test that uses sound waves to create images of your heart. It provides your doctor with information about the size and shape of your heart and how well your heart's chambers and valves are working. This procedure takes approximately one hour. There are no restrictions for this procedure.     Follow-Up:  1 MONTH WITH DR Delton See OR AT NEXT AVAILABLE AROUND THAT TIME

## 2015-06-24 ENCOUNTER — Ambulatory Visit (HOSPITAL_COMMUNITY): Payer: Medicaid Other | Attending: Cardiology

## 2015-06-24 ENCOUNTER — Other Ambulatory Visit: Payer: Self-pay

## 2015-06-24 DIAGNOSIS — R0609 Other forms of dyspnea: Secondary | ICD-10-CM | POA: Diagnosis not present

## 2015-06-24 DIAGNOSIS — R0600 Dyspnea, unspecified: Secondary | ICD-10-CM | POA: Diagnosis present

## 2015-06-24 DIAGNOSIS — R9431 Abnormal electrocardiogram [ECG] [EKG]: Secondary | ICD-10-CM | POA: Insufficient documentation

## 2015-06-24 DIAGNOSIS — I10 Essential (primary) hypertension: Secondary | ICD-10-CM | POA: Insufficient documentation

## 2015-06-24 DIAGNOSIS — I340 Nonrheumatic mitral (valve) insufficiency: Secondary | ICD-10-CM | POA: Insufficient documentation

## 2015-06-24 DIAGNOSIS — R600 Localized edema: Secondary | ICD-10-CM | POA: Diagnosis not present

## 2015-06-24 DIAGNOSIS — I517 Cardiomegaly: Secondary | ICD-10-CM | POA: Diagnosis not present

## 2015-06-24 DIAGNOSIS — Z8249 Family history of ischemic heart disease and other diseases of the circulatory system: Secondary | ICD-10-CM | POA: Diagnosis not present

## 2015-06-26 ENCOUNTER — Telehealth: Payer: Self-pay | Admitting: Family Medicine

## 2015-06-26 NOTE — Telephone Encounter (Signed)
Pt called because she was cleared by the heart doctor to have her surgery. The issues is she doesn't remember the doctor who is going to do the surgery. She would like to have the name and number to that office. jw

## 2015-06-29 NOTE — Telephone Encounter (Signed)
Left message on patient voicemail with the name and number to [facility], [phone].

## 2015-07-03 ENCOUNTER — Ambulatory Visit (INDEPENDENT_AMBULATORY_CARE_PROVIDER_SITE_OTHER): Payer: Medicaid Other | Admitting: Family Medicine

## 2015-07-03 ENCOUNTER — Encounter: Payer: Self-pay | Admitting: Family Medicine

## 2015-07-03 VITALS — BP 145/95 | HR 82 | Temp 98.7°F | Ht 61.0 in | Wt 330.0 lb

## 2015-07-03 DIAGNOSIS — R600 Localized edema: Secondary | ICD-10-CM | POA: Diagnosis not present

## 2015-07-03 DIAGNOSIS — S80869D Insect bite (nonvenomous), unspecified lower leg, subsequent encounter: Secondary | ICD-10-CM

## 2015-07-03 DIAGNOSIS — L989 Disorder of the skin and subcutaneous tissue, unspecified: Secondary | ICD-10-CM | POA: Diagnosis not present

## 2015-07-03 DIAGNOSIS — W57XXXD Bitten or stung by nonvenomous insect and other nonvenomous arthropods, subsequent encounter: Secondary | ICD-10-CM

## 2015-07-03 MED ORDER — POTASSIUM CHLORIDE CRYS ER 20 MEQ PO TBCR: 20.0000 meq | EXTENDED_RELEASE_TABLET | Freq: Every day | ORAL | Status: DC

## 2015-07-03 NOTE — Assessment & Plan Note (Signed)
Worsening with inc # of skin lesions / ?arthropod bites. Now seems purulence is mostly resolved without active drainage, some weeping reported. Less likely superinfection without evidence of cellulitis, no abscess. Still scratching with itching, with edema probably. Initially concern for fleas / arthropods, now seeming less likely due to chronic nature >2-3 months, and wife/family do not have any similar lesions and stay in same bed, pets were treated no other known exposures. Ruled out infection / atypical infection with (CBC, HIV, RPR negative). Suspect now that this may be due more to the peripheral edema, and could be secondary skin changes.  Plan: 1. Treat underlying bilateral lower ext edema with Lasix trial, prescribed by Cardiology (pt wasn't taking before) 2. Continue topical Mupirocin PRN, moisturizers, anti-itch topical 3. Referral to Dermatology, given persistence and now worsening 4. May follow-up with Dermatology Clinic at [facility] in meantime, consider punch biopsy per patient request (however, I do not see significant utility in this at this time).

## 2015-07-03 NOTE — Patient Instructions (Signed)
Dear Byrd Hesselbach, Thank you for coming in to clinic today.  1. Start taking Lasix  daily for 1 week, then can reduce to  (half tab) if swelling significantly improves - Keep taking Hydrochlorothiazide, and STOP taking Amlodipine - I will send in a potassium supplement as well to take once daily with the Lasix  2. Referral to Dermatologist, may take a few weeks to arrange  Keep trying to use Moisturizer and Mupirocin cream use 2 times daily for up to 2 weeks for relief  Good news is from Cardiologist, this is not from your Heart, and we have checked Liver and Kidneys without problem.  Again I do not believe that this is from a Parasite.  Some important numbers from today's visit:  Please schedule a follow-up appointment in 1-2 weeks to see Dr. Althea Charon for Leg Swelling  Please also schedule a [facility] appointment for Lesions On Legs. (call to schedule)  If you have any other questions or concerns, please feel free to call the clinic to contact me. You may also schedule an earlier appointment if necessary.  However, if your symptoms get significantly worse, please go to the Emergency Department to seek immediate medical attention.  Saralyn Pilar, DO [facility]

## 2015-07-03 NOTE — Assessment & Plan Note (Signed)
Persistent b/l lower ext edema below knees, without worsening today, likely component of chronic venous insufficiency. Edema improves with elevation, but persistent, likely still multifactorial with morbid obesity, limited activity, salt intake. Ruled out CHF with normal ECHO 06/24/15 LVEF 65-70%, prior LFTs within 1 year were normal (noted last albumin was 3.2 in 2015) normal SCr less likely to be liver or kidney etiology. Notable increased # of bite-like skin lesions b/l lower ext, also with some chronically on upper ext, which may now be due to edema. - Inadequate adherence to medical therapy - did not start Lasix rx by Cards due to concern about side effects, and continues Amlodipine  Plan: 1. Discontinue Amlodipine (concern may be worsening peripheral edema) 2. Start previously prescribed Lasix  daily x 1 week, then if improved may reduce to half to whole tab PRN 3. May continue HCTZ  daily (previously told to stop by Cards) 4. Add potassium supplement K-dur daily with Lasix dose 5. Elevate, compression, low salt diet, stay active 6. RTC 1-2 weeks for re-evaluation of edema, emphasized importance of adherence to Lasix trial. May also contact endocrinologist at [facility] regarding testosterone side-effects

## 2015-07-03 NOTE — Progress Notes (Signed)
Subjective:    Patient ID: Rachael Rogers, female    DOB: [birth year], 39 y.o.   MRN: [id number]  Rachael Rogers is a 39 y.o. female presenting on 07/03/2015 for Follow-up  HPI   FOLLOW-UP SWELLING of B/L LOWER LEGS: - Last seen by me on 05/26/15 and prior 04/27/15 for follow-up of same complaint. Brief review with prior multiple scattered bilateral lower ext variable stage open lesions consistent with insect bites, initially thought to be fleas, reported to have pets with fleas, but this has since been resolved, and no other family members have any similar skin lesions. Initially patient was treated for potential MRSA, as some of the bites appeared pustular, without extensive cellulitis, treated with Doxycycline and Cipro x 10 day course. At last visit, thought that bilateral lower ext edema may be responsible, with significant obesity thought to have some chronic venous insufficiency, likely multifactorial with chronic history of b/l LE edema. Prior LFTs normal and kidney function normal. Known DOE and uncontrolled HTN, family history of heart disease, patient was referred to Cardiology for evaluation of edema, DOE, and also cardiac clearance prior to upcoming Surgical Repair of Ventral Hernia. Saw Dr. Tobias Alexander (on 06/11/15), proceeded with ECHO (06/24/15) with normal results, LVEF 65-70%, no diastolic dysfunction, valvular or hypertrophy identified. Cardiology started Lasix 40mg , DC'd HCTZ and continued Amlodipine, addition to prior recs by both of [place] to reduce salt, stay active, elevate legs. - Today patient returns with persistent symptoms, without significant worsening. Still with bilateral lower ext below knee to foot swelling. Did not start taking Lasix due to concern about side effects. Has continued Amlodipine and HCTZ. Still has extensive bilateral lower ext and some upper ext scattered bite lesions, complains of pain and itching. Again, re-emphasized no concern for insects, as no family members  have similar and they sleep in same bed. Lesions sometimes have "weeping" or pustules. Swelling does improve with elevation and then returns the next day. Itching is worsening. Tried using coco butter with some relief, then occasional use of bacitracin. - Asking if testosterone can cause swelling, reassured that this would be less likely to be the acute cause since has been taking >1 year. No prior problems. Has not followed up with prescriber at [facility] for [facility]. - Denies any fevers/chills, redness of skin or focal abscess, drainage of pus or bleeding, numbness tingling, weakness, nausea / vomiting  Past Medical History  Diagnosis Date  . Asthma   . Sleep apnea 2011  . Heart murmur 1990  . Hypertension   . Arthritis of knee     Social History   Social History  . Marital Status: Single    Spouse Name: N/A  . Number of Children: N/A  . Years of Education: N/A   Occupational History  . Not on file.   Social History Main Topics  . Smoking status: Current Every Day Smoker -- 0.30 packs/day for 25 years    Types: Cigarettes  . Smokeless tobacco: Never Used  . Alcohol Use: Yes     Comment: Hx binge drinking - has cut down  . Drug Use: Yes    Special: Marijuana     Comment: quit in 2008  . Sexual Activity:    Partners: Female   Other Topics Concern  . Not on file   Social History Narrative    Current Outpatient Prescriptions on File Prior to Visit  Medication Sig  . albuterol (PROVENTIL) (2.5 MG/3ML) 0.083% nebulizer solution Take 3 mLs (2.5  mg total) by nebulization every 6 (six) hours as needed for wheezing.  . Albuterol Sulfate (PROAIR HFA IN) Inhale into the lungs as directed.  . docusate sodium (COLACE) 100 MG capsule Take 1 capsule (100 mg total) by mouth 2 (two) times daily.  . Fluticasone Propionate HFA (FLOVENT HFA IN) Inhale into the lungs as directed.  . furosemide (LASIX) 40 MG tablet Take 1 tablet (40 mg total) by mouth daily.  Marland Kitchen omeprazole (PRILOSEC) 40 MG  capsule Take 1 capsule (40 mg total) by mouth daily.  Marland Kitchen testosterone cypionate (DEPOTESTOTERONE CYPIONATE) 200 MG/ML injection Inject 200 mg into the muscle every 14 (fourteen) days.   No current facility-administered medications on file prior to visit.    Review of Systems  Constitutional: Negative for fever, chills, diaphoresis, activity change, appetite change and fatigue.  HENT: Negative for congestion and hearing loss.   Eyes: Negative for visual disturbance.  Respiratory: Negative for cough, chest tightness, shortness of breath and wheezing.   Cardiovascular: Positive for leg swelling (bilateral below knee including feet). Negative for chest pain and palpitations.  Gastrointestinal: Negative for nausea, vomiting, abdominal pain, diarrhea and constipation.  Genitourinary: Negative for dysuria, frequency and hematuria.  Musculoskeletal: Negative for arthralgias and neck pain.  Skin: Positive for rash (bilateral lower ext, chronic ).  Neurological: Negative for dizziness, weakness, light-headedness, numbness and headaches.  Hematological: Negative for adenopathy.   Per HPI unless specifically indicated above     Objective:    BP 145/95 mmHg  Pulse 82  Temp(Src) 98.7 F (37.1 C) (Oral)  Ht 5\' 1"  (1.549 m)  Wt 330 lb (149.687 kg)  BMI 62.39 kg/m2  Wt Readings from Last 3 Encounters:  07/03/15 330 lb (149.687 kg)  06/11/15 340 lb (154.223 kg)  05/26/15 337 lb 11.2 oz (153.18 kg)    Physical Exam  Constitutional: She is oriented to person, place, and time. She appears well-developed and well-nourished. No distress.  Morbid obesity, appears comfortable and cooperative  HENT:  Head: Normocephalic and atraumatic.  Mouth/Throat: Oropharynx is clear and moist.  Eyes: Conjunctivae and EOM are normal. Pupils are equal, round, and reactive to light.  Neck: Normal range of motion. Neck supple. No thyromegaly present.  Cardiovascular: Normal rate, regular rhythm, normal heart sounds  and intact distal pulses.   No murmur heard. Pulmonary/Chest: Effort normal and breath sounds normal. No respiratory distress. She has no wheezes. She has no rales.  Musculoskeletal: Normal range of motion. She exhibits edema (bilateral non-pitting edema below knees including feet, stable) and tenderness (generalized bilateral lower extremity tenderness with skin lesions, no calf tenderness, negative Homan's).  Lymphadenopathy:    She has no cervical adenopathy.  Neurological: She is alert and oriented to person, place, and time.  Skin: Skin is warm and dry. Rash (Mostly stable b/l lower ext below knees scattered diffuse small < 0.5cm round bite-like lesions various stages of healing, some darkened 1cm healed spots others superificial opening. No erythema or purulence.) noted. She is not diaphoretic.  Nursing note and vitals reviewed.  Note lower extremity skin lesions were photographed on 04/27/15, see OV for image. Today with notable worsening and increase in number of lesions.  Results for orders placed or performed in visit on 05/26/15  BASIC METABOLIC PANEL WITH GFR  Result Value Ref Range   Sodium 139 135 - 146 mmol/L   Potassium 4.3 3.5 - 5.3 mmol/L   Chloride 104 98 - 110 mmol/L   CO2 23 20 - 31 mmol/L  Glucose, Bld 93 65 - 99 mg/dL   BUN 14 7 - 25 mg/dL   Creat 1.61 0.96 - 0.45 mg/dL   Calcium 9.1 8.6 - 40.9 mg/dL   GFR, Est African American 82 >=60 mL/min   GFR, Est Non African American 72 >=60 mL/min  CBC  Result Value Ref Range   WBC 9.6 4.0 - 10.5 K/uL   RBC 5.47 (H) 3.87 - 5.11 MIL/uL   Hemoglobin 15.4 (H) 12.0 - 15.0 g/dL   HCT 81.1 (H) 91.4 - 78.2 %   MCV 87.0 78.0 - 100.0 fL   MCH 28.2 26.0 - 34.0 pg   MCHC 32.4 30.0 - 36.0 g/dL   RDW 95.6 21.3 - 08.6 %   Platelets 358 150 - 400 K/uL   MPV 10.5 8.6 - 12.4 fL  RPR  Result Value Ref Range   RPR Ser Ql NON REAC NON REAC  HIV antibody  Result Value Ref Range   HIV 1&2 Ab, 4th Generation NONREACTIVE NONREACTIVE        Assessment & Plan:   Problem List Items Addressed This Visit      Musculoskeletal and Integument   Skin lesions   Relevant Orders   Ambulatory referral to Dermatology     Other   Arthropod bite of multiple sites of lower extremity    Worsening with inc # of skin lesions / ?arthropod bites. Now seems purulence is mostly resolved without active drainage, some weeping reported. Less likely superinfection without evidence of cellulitis, no abscess. Still scratching with itching, with edema probably. Initially concern for fleas / arthropods, now seeming less likely due to chronic nature >2-3 months, and wife/family do not have any similar lesions and stay in same bed, pets were treated no other known exposures. Ruled out infection / atypical infection with (CBC, HIV, RPR negative). Suspect now that this may be due more to the peripheral edema, and could be secondary skin changes.  Plan: 1. Treat underlying bilateral lower ext edema with Lasix trial, prescribed by Cardiology (pt wasn't taking before) 2. Continue topical Mupirocin PRN, moisturizers, anti-itch topical 3. Referral to Dermatology, given persistence and now worsening 4. May follow-up with Dermatology Clinic at [facility] in meantime, consider punch biopsy per patient request (however, I do not see significant utility in this at this time).      Relevant Orders   Ambulatory referral to Dermatology   Bilateral lower extremity edema - Primary    Persistent b/l lower ext edema below knees, without worsening today, likely component of chronic venous insufficiency. Edema improves with elevation, but persistent, likely still multifactorial with morbid obesity, limited activity, salt intake. Ruled out CHF with normal ECHO 06/24/15 LVEF 65-70%, prior LFTs within 1 year were normal (noted last albumin was 3.2 in 2015) normal SCr less likely to be liver or kidney etiology. Notable increased # of bite-like skin lesions b/l lower ext, also with some  chronically on upper ext, which may now be due to edema. - Inadequate adherence to medical therapy - did not start Lasix rx by Cards due to concern about side effects, and continues Amlodipine  Plan: 1. Discontinue Amlodipine (concern may be worsening peripheral edema) 2. Start previously prescribed Lasix 40mg  daily x 1 week, then if improved may reduce to half to whole tab PRN 3. May continue HCTZ 25mg  daily (previously told to stop by Cards) 4. Add potassium supplement K-dur daily with Lasix dose 5. Elevate, compression, low salt diet, stay active 6. RTC 1-2  weeks for re-evaluation of edema, emphasized importance of adherence to Lasix trial. May also contact endocrinologist at [facility] regarding testosterone side-effects      Relevant Medications   potassium chloride SA (K-DUR,KLOR-CON) 20 MEQ tablet      Meds ordered this encounter  Medications  . potassium chloride SA (K-DUR,KLOR-CON) 20 MEQ tablet    Sig: Take 1 tablet (20 mEq total) by mouth daily.    Dispense:  20 tablet    Refill:  0      Follow up plan: Return in about 2 weeks (around 07/17/2015), or if symptoms worsen or fail to improve, for leg edema, skin lesions.  Saralyn Pilar, DO [facility], PGY-3

## 2015-07-22 ENCOUNTER — Encounter: Payer: Self-pay | Admitting: Cardiology

## 2015-07-22 ENCOUNTER — Ambulatory Visit (INDEPENDENT_AMBULATORY_CARE_PROVIDER_SITE_OTHER): Payer: Medicaid Other | Admitting: Cardiology

## 2015-07-22 VITALS — BP 132/74 | HR 83 | Ht 61.0 in | Wt 341.0 lb

## 2015-07-22 DIAGNOSIS — Z01810 Encounter for preprocedural cardiovascular examination: Secondary | ICD-10-CM

## 2015-07-22 DIAGNOSIS — I10 Essential (primary) hypertension: Secondary | ICD-10-CM

## 2015-07-22 DIAGNOSIS — L03031 Cellulitis of right toe: Secondary | ICD-10-CM

## 2015-07-22 DIAGNOSIS — R600 Localized edema: Secondary | ICD-10-CM

## 2015-07-22 MED ORDER — FUROSEMIDE 40 MG PO TABS: 20.0000 mg | ORAL_TABLET | Freq: Every day | ORAL | Status: DC | PRN

## 2015-07-22 NOTE — Progress Notes (Signed)
Patient ID: Rachael Rogers, female   DOB: Nov 09, 1975, 39 y.o.   MRN: [id number]      Cardiology Office Note   Date:  07/22/2015   ID:  HUBERT DERSTINE, DOB 1976-02-12, MRN [id number]  PCP:  Saralyn Pilar, DO  Cardiologist:  Lars Masson, MD   Chief complain: LE edema, DOE   History of Present Illness:  Rachael Rogers is a 39 y.o. female who presents for evaluation of LE edema, DOE and prop evaluation for ventral hermia. She is a transgender female on testosterone therapy with h/o obesity, physical inactivity, hypertension and family h/o premature CAD. Murmur was told that she had a murmur as a child. She has been chronically short of breath on exertion, she would have to stop to walk one flight of stairs, walk a block or when shopping groceries.  This hasn't changed in the last few years. She has attributed it to obesity and asthma. She has recently lost 20 lbs. She developed LE edema, believed to be sec to cellulitis sec to flea bites. Le edema improved on hctz, but hasn't resolved. She denies orthopnea, PND, palpitations or syncope. No chest pain.  She comes back after 6 weeks, continues to have on and off LE edema, initially improved with lasix then came back. She is scheduled to see a dermatologist as this might be a cellulitis secondary to flea bites. Denies chest pain, SOB, palpitations, orthopnea, PND.    Past Medical History  Diagnosis Date  . Asthma   . Sleep apnea 2011  . Heart murmur 1990  . Hypertension   . Arthritis of knee     Past Surgical History  Procedure Laterality Date  . Appendectomy  1996   Current Outpatient Prescriptions  Medication Sig Dispense Refill  . albuterol (PROVENTIL) (2.5 MG/3ML) 0.083% nebulizer solution Take 3 mLs (2.5 mg total) by nebulization every 6 (six) hours as needed for wheezing. 150 mL 1  . Albuterol Sulfate (PROAIR HFA IN) Inhale 2 puffs into the lungs as directed.     . Fluticasone Propionate HFA (FLOVENT HFA IN) Inhale  into the lungs as directed.    . furosemide (LASIX) 40 MG tablet Take 20 mg by mouth daily.    Marland Kitchen testosterone cypionate (DEPOTESTOTERONE CYPIONATE) 200 MG/ML injection Inject 200 mg into the muscle every 14 (fourteen) days.  0   No current facility-administered medications for this visit.   Allergies:   Food and Theophyllines   Social History:  The patient  reports that she has been smoking Cigarettes.  She has a 7.5 pack-year smoking history. She has never used smokeless tobacco. She reports that she drinks alcohol. She reports that she uses illicit drugs (Marijuana).   Family History:  The patient's family history includes Asthma in her father; Heart failure in her mother; Hyperlipidemia in her father and mother; Hypertension in her father and mother; Stroke in her maternal grandmother.    ROS:  Please see the history of present illness.    All other systems are reviewed and negative.   PHYSICAL EXAM: VS:  BP 132/74 mmHg  Pulse 83  Ht  (1.549 m)  Wt 341 lb (154.677 kg)  BMI 64.46 kg/m2 , BMI Body mass index is 64.46 kg/(m^2). GEN: morbidly obese, in no acute distress HEENT: normal Neck: no JVD, carotid bruits, or masses Cardiac: RRR; short systolic murmurmurmurs, rubs, or gallops, mild B/L edema, bite wounds, healed, pigmentations spots  Respiratory:  clear to auscultation bilaterally, normal work of  breathing GI: soft, nontender, nondistended, + BS MS: no deformity or atrophy Skin: warm and dry, no rash Neuro:  Strength and sensation are intact Psych: euthymic mood, full affect   EKG:  EKG : SR, possible LAE, LVH  Recent Labs: 08/23/2014: ALT 10 05/26/2015: BUN 14; Creat 0.99; Hemoglobin 15.4*; Platelets 358; Potassium 4.3; Sodium 139   Lipid Panel    Component Value Date/Time   CHOL 182 04/24/2013 1159   TRIG 93 04/24/2013 1159   HDL 38* 04/24/2013 1159   CHOLHDL 4.8 04/24/2013 1159   VLDL 19 04/24/2013 1159   LDLCALC 125* 04/24/2013 1159    Wt Readings from  Last 3 Encounters:  07/22/15 341 lb (154.677 kg)  07/03/15 330 lb (149.687 kg)  06/11/15 340 lb (154.223 kg)    ECG: SR, possible LAE, otherwise normal ECG  TTE: 06/24/2015  - Left ventricle: The cavity size was normal. There was mild concentric hypertrophy. Systolic function was vigorous. The estimated ejection fraction was in the range of 65% to 70%. Wall motion was normal; there were no regional wall motion abnormalities. There was no evidence of elevated ventricular filling pressure by Doppler parameters. - Mitral valve: There was trivial regurgitation. - Left atrium: The atrium was mildly dilated.     ASSESSMENT AND PLAN:  1. LE edema, DOE - echocardiogram showed normal syst and diast function, this is most probably multifactorial sec to obesity, chronic venous stasis and cellulitis sec to flea bites.  Continue start lasix 40 mg po daily PRN.  2. Hypertension - controlled  3. Preop evaluation - cleared, no contraindication from cardiac standpoint, no further work up needed.  4. Smoking - advised to stop  5. Obesity - she is exercising and loosing weight  Signed, Lars Masson, MD  07/22/2015 1:31 PM    [facility] [address] [place], [place], [place]  [address] Phone: [phone]; Fax: [phone]

## 2015-07-22 NOTE — Patient Instructions (Signed)
Your physician has recommended you make the following change in your medication:   START TAKING YOUR LASIX 20 MG DAILY ONLY AS NEEDED FOR LOWER EXTREMITY EDEMA       Your physician recommends that you schedule a follow-up appointment in: AS NEEDED WITH DR Delton See

## 2016-01-05 ENCOUNTER — Ambulatory Visit (INDEPENDENT_AMBULATORY_CARE_PROVIDER_SITE_OTHER): Payer: Medicaid Other | Admitting: Family Medicine

## 2016-01-05 ENCOUNTER — Encounter: Payer: Self-pay | Admitting: Family Medicine

## 2016-01-05 VITALS — BP 180/82 | HR 90 | Temp 98.4°F | Wt 334.9 lb

## 2016-01-05 DIAGNOSIS — I10 Essential (primary) hypertension: Secondary | ICD-10-CM

## 2016-01-05 DIAGNOSIS — J309 Allergic rhinitis, unspecified: Secondary | ICD-10-CM

## 2016-01-05 DIAGNOSIS — K439 Ventral hernia without obstruction or gangrene: Secondary | ICD-10-CM | POA: Diagnosis not present

## 2016-01-05 DIAGNOSIS — R1013 Epigastric pain: Secondary | ICD-10-CM

## 2016-01-05 DIAGNOSIS — S93402A Sprain of unspecified ligament of left ankle, initial encounter: Secondary | ICD-10-CM

## 2016-01-05 DIAGNOSIS — J302 Other seasonal allergic rhinitis: Secondary | ICD-10-CM

## 2016-01-05 DIAGNOSIS — K219 Gastro-esophageal reflux disease without esophagitis: Secondary | ICD-10-CM

## 2016-01-05 MED ORDER — OMEPRAZOLE 20 MG PO CPDR: 20.0000 mg | DELAYED_RELEASE_CAPSULE | Freq: Every day | ORAL | Status: DC

## 2016-01-05 MED ORDER — LORATADINE 10 MG PO TABS: 10.0000 mg | ORAL_TABLET | Freq: Every day | ORAL | Status: DC

## 2016-01-05 NOTE — Patient Instructions (Signed)
Thank you for coming in to clinic today.  1. For your cough, sinus congestion, and headaches - this is rhinosinusitis caused by allergies, initial virus - Take Loratadine (Claritin) one pill daily for 1 month to see if this helps, may continue with refills given - Also I would recommend a Nasonex nasal steroid or Nasal Saline flush as well to help this - Take Tylenol or Ibuprofen as needed for headaches  2. For your Chest wall / abdominal pain and heartburn, recommend taking Omeprazole stomach acid pill every day 30 min prior to eating for next 2 to 4 weeks to see if this helps your symptoms. - I will go ahead and refer you to Gastroenterology, you should get an appointment soon  3. For your Left Ankle pain, i think you sprained it  Do not do activities that cause pain You may benefit from an Ankle Brace (over the counter) to limit movement in your ankle OR use your boots Ice the area for 20 minutes at least 3-4 times a day Wrap the ankle with a compression wrap to prevent swelling Elevate the leg above the level of the heart as much as possible   Please schedule a follow-up appointment with Dr Althea CharonKaramalegos in 1 to 3 months as needed for follow-up Abdominal Pain / Acid Reflux, otherwise return sooner if your Left Ankle Pain worsens.  If you have any other questions or concerns, please feel free to call the clinic to contact me. You may also schedule an earlier appointment if necessary.  However, if your symptoms get significantly worse, please go to the Emergency Department to seek immediate medical attention.  Saralyn PilarAlexander Danetta Prom, DO [facility]

## 2016-01-05 NOTE — Progress Notes (Signed)
Subjective:    Patient ID: Rachael Rogers, female    DOB: 1975/10/31, 40 y.o.   MRN: [id number]  Rachael Rogers is a 40 y.o. female presenting on 01/05/2016 for coughing   Patient presents for a same day appointment.   HPI   URI / COUGH / SINUSITIS: Reported symptoms started about 2 weeks ago with URI symptoms, nasal congestion, productive cough. Seemed to improve with OTC cold medicines, then symptoms shifted to more sinus congestion, pressure, and frontal headaches (alternating, Left to Right at times). - Did not have flu shot this season. No significant sick contacts. - Admits occasional sweats - Denies any fevers/chills, purulent discharge, dizziness, nausea, vomiting, vision changes, eye drainage, ear pain  GERD / Supraumbilical Ventral Hernia: - Reports chest wall pain and associated heartburn usually onset within 2-3 hours of eating, worse at night, worse laying on stomach (now difficulty sleeping due to pain and has to sleep on back). Occasional Tums with some partial relief. Prior history of GERD, has been treated with PPI back in 2015 with some relief, however has not had regular treatment. Never established with GI. Prior testing negative for h pylori. - Additional history with abdominal wall hernia, established with [facility] Dr Karie SodaSteven Gross, last saw 03/2015 in consultation for surgical repair of this supraumbilical ventral hernia, concern for incarcerated hernia at that time and recommending mesh repair, however this is likely unrelated to GERD pain. - Admits cough - Denies nausea, vomiting, odynphagia  LEFT ANKLE SPRAIN: - Describes episode 1 week ago while walking a dog, changed directions quickly and "rolled left ankle", some pain initially with difficulty walking, gradually improved. Able to bear weight. No significant treatments, other than wearing tight lacing boots for ankle support, and improved with rest. Now 1 week later, significant improvement, mild pain to touch and  residual swelling. - No prior ankle fracture, injury, surgery - Denies any other joint injury or trauma, fall, ecchymosis, numbness, tingling, weakness  Social History  Substance Use Topics  . Smoking status: Current Every Day Smoker -- 0.30 packs/day for 25 years    Types: Cigarettes  . Smokeless tobacco: Never Used  . Alcohol Use: Yes     Comment: Hx binge drinking - has cut down    Review of Systems Per HPI unless specifically indicated above     Objective:    BP 180/82 mmHg  Pulse 90  Temp(Src) 98.4 F (36.9 C) (Oral)  Wt 334 lb 14.4 oz (151.91 kg)  Wt Readings from Last 3 Encounters:  01/05/16 334 lb 14.4 oz (151.91 kg)  07/22/15 341 lb (154.677 kg)  07/03/15 330 lb (149.687 kg)    Physical Exam  Constitutional: She is oriented to person, place, and time. She appears well-developed and well-nourished. No distress.  Morbidly obese, well appearing, comfortable and cooperative  HENT:  Head: Normocephalic and atraumatic.  Sinuses non-tender. Nares with turbinate edema and mild congestion without purulence. Oropharynx clear without erythema, edema, exudates or asymmetry. TM's bilateral clear without effusion, erythema.  Eyes: Conjunctivae are normal. Right eye exhibits no discharge. Left eye exhibits no discharge.  Bilateral allergic shiners  Neck: Normal range of motion. Neck supple.  Cardiovascular: Normal rate, regular rhythm, normal heart sounds and intact distal pulses.   No murmur heard. Pulmonary/Chest: Effort normal and breath sounds normal. No respiratory distress. She has no wheezes. She has no rales. She exhibits tenderness (Reproducible lower chest wall and epigastric pain).  Distant breath sounds with large body habitus  Abdominal:  Soft. Bowel sounds are normal. She exhibits no distension and no mass. There is tenderness (Mild epigastric tenderness on palpation). There is no rebound and no guarding.  Obese  Musculoskeletal:  Left Ankle Inspection: Mild non  pitting edema compared to Right localized around ankle laterally. Palpation: Mild +TTP soft tissue lateral ankle inferior and posterior to lateral malleolus, non-tender over bony lat malleolus. Achilles tendon intact and non-tender. ROM: intact active ROM, slightly increased laxity on talar tilt Strength: 5/5 intact ankle dorsiflex/plantar flex Neurovascular: distally intact   Neurological: She is alert and oriented to person, place, and time. No cranial nerve deficit.  Skin: Skin is warm and dry. Rash (stable scattered small skin lesions / scabs) noted. She is not diaphoretic.  Nursing note and vitals reviewed.      Assessment & Plan:   Problem List Items Addressed This Visit    Allergic rhinosinusitis - Primary    Consistent with acute-subacute rhinosinusitis, likely initially viral URI vs allergic etiology with improvement now with persistent sinus congestion without evidence of acute bacterial infection. - Afebrile, well-appearing, sinuses non-tender without purulence  Plan: 1. Reassurance, likely self-limited - no indication for antibiotics at this time 2. Start Claritin  OTC trial x 1 month, may continue if improved 3. Offered Flonase, but declined all nasal sprays including saline, advised would get sub-optimal results if not using any intranasal treatment 4. Return criteria reviewed       Relevant Medications   loratadine (CLARITIN) 10 MG tablet   GERD (gastroesophageal reflux disease)    Suspected GERD with heartburn, typical symptoms. Prior history of with improvement on PPI. - No red flag GI symptoms. Many risk factors with morbid obesity, dietary choices etc - Inadequate lifestyle modifications / OTC treatments  Plan: 1. Refilled Omeprazole  daily x 2-4 weeks 2. Referral to GI for consultation of GERD, prior to potential abdominal wall ventral hernia surgery, by request of patient and gen surg      Relevant Medications   omeprazole (PRILOSEC) 20 MG capsule     Hypertension    Uncontrolled. Again significantly elevated initially electronic >200/100, manual repeat 180/82 - Again self discontinued meds (Amlodipine, Lisinopril-HCTZ)  Plan: 1. Continue Lasix per Cardiology for BP and swelling 2. Briefly counseled on importance of taking BP meds, however patient convinced that BP is controlled outside of doctors office, but does not have numbers written down 3. Follow-up, repeat BP and would refer to Dr Raymondo Band pharmacy clinic for ambulatory BP monitoring in future      Ventral hernia    Suspect underlying abdominal / chest wall pain may be related to persistent ventral hernia, thought to have some recurrent incarceration of subq fat.  Plan: 1. Recommend follow-up with Gen Surg to determine surgical options, again advised importance of weight loss and smoking cessation prior to surgery 2. Treat underlying GERD        # Left Ankle Sprain mild-mod L-ankle inversion sprain, likely grade 1, localized pain over ATF ligament, improving edema, w/o ecchymosis. RICE, brace vs own boots. Return criteria given.    Meds ordered this encounter  Medications  . loratadine (CLARITIN) 10 MG tablet    Sig: Take 1 tablet (10 mg total) by mouth daily.    Dispense:  30 tablet    Refill:  5  . omeprazole (PRILOSEC) 20 MG capsule    Sig: Take 1 capsule (20 mg total) by mouth daily. 30 min before first meal.    Dispense:  30 capsule  Refill:  1      Follow up plan: Return in about 3 months (around 04/06/2016) for GERD.  Saralyn Pilar, DO [facility], PGY-3

## 2016-01-06 DIAGNOSIS — K219 Gastro-esophageal reflux disease without esophagitis: Secondary | ICD-10-CM | POA: Insufficient documentation

## 2016-01-06 DIAGNOSIS — J309 Allergic rhinitis, unspecified: Secondary | ICD-10-CM | POA: Insufficient documentation

## 2016-01-06 NOTE — Assessment & Plan Note (Signed)
Suspect underlying abdominal / chest wall pain may be related to persistent ventral hernia, thought to have some recurrent incarceration of subq fat.  Plan: 1. Recommend follow-up with Gen Surg to determine surgical options, again advised importance of weight loss and smoking cessation prior to surgery 2. Treat underlying GERD

## 2016-01-06 NOTE — Assessment & Plan Note (Signed)
Suspected GERD with heartburn, typical symptoms. Prior history of with improvement on PPI. - No red flag GI symptoms. Many risk factors with morbid obesity, dietary choices etc - Inadequate lifestyle modifications / OTC treatments  Plan: 1. Refilled Omeprazole 20mg  daily x 2-4 weeks 2. Referral to GI for consultation of GERD, prior to potential abdominal wall ventral hernia surgery, by request of patient and gen surg

## 2016-01-06 NOTE — Assessment & Plan Note (Signed)
Uncontrolled. Again significantly elevated initially electronic >200/100, manual repeat 180/82 - Again self discontinued meds (Amlodipine, Lisinopril-HCTZ)  Plan: 1. Continue Lasix per Cardiology for BP and swelling 2. Briefly counseled on importance of taking BP meds, however patient convinced that BP is controlled outside of doctors office, but does not have numbers written down 3. Follow-up, repeat BP and would refer to Dr Raymondo BandKoval pharmacy clinic for ambulatory BP monitoring in future

## 2016-01-06 NOTE — Assessment & Plan Note (Signed)
Consistent with acute-subacute rhinosinusitis, likely initially viral URI vs allergic etiology with improvement now with persistent sinus congestion without evidence of acute bacterial infection. - Afebrile, well-appearing, sinuses non-tender without purulence  Plan: 1. Reassurance, likely self-limited - no indication for antibiotics at this time 2. Start Claritin 10mg  OTC trial x 1 month, may continue if improved 3. Offered Flonase, but declined all nasal sprays including saline, advised would get sub-optimal results if not using any intranasal treatment 4. Return criteria reviewed

## 2016-01-07 ENCOUNTER — Emergency Department (HOSPITAL_COMMUNITY)
Admission: EM | Admit: 2016-01-07 | Discharge: 2016-01-07 | Disposition: A | Discharge status: Home / Self Care | Payer: Medicaid Other | Attending: Emergency Medicine | Admitting: Emergency Medicine

## 2016-01-07 ENCOUNTER — Encounter (HOSPITAL_COMMUNITY): Payer: Self-pay | Admitting: Cardiology

## 2016-01-07 DIAGNOSIS — Z8669 Personal history of other diseases of the nervous system and sense organs: Secondary | ICD-10-CM | POA: Insufficient documentation

## 2016-01-07 DIAGNOSIS — Z79899 Other long term (current) drug therapy: Secondary | ICD-10-CM | POA: Diagnosis not present

## 2016-01-07 DIAGNOSIS — J45909 Unspecified asthma, uncomplicated: Secondary | ICD-10-CM | POA: Insufficient documentation

## 2016-01-07 DIAGNOSIS — E669 Obesity, unspecified: Secondary | ICD-10-CM | POA: Insufficient documentation

## 2016-01-07 DIAGNOSIS — F17210 Nicotine dependence, cigarettes, uncomplicated: Secondary | ICD-10-CM | POA: Insufficient documentation

## 2016-01-07 DIAGNOSIS — Z8739 Personal history of other diseases of the musculoskeletal system and connective tissue: Secondary | ICD-10-CM | POA: Diagnosis not present

## 2016-01-07 DIAGNOSIS — I10 Essential (primary) hypertension: Secondary | ICD-10-CM | POA: Diagnosis not present

## 2016-01-07 DIAGNOSIS — Z7951 Long term (current) use of inhaled steroids: Secondary | ICD-10-CM | POA: Insufficient documentation

## 2016-01-07 DIAGNOSIS — R51 Headache: Secondary | ICD-10-CM

## 2016-01-07 DIAGNOSIS — R011 Cardiac murmur, unspecified: Secondary | ICD-10-CM | POA: Insufficient documentation

## 2016-01-07 DIAGNOSIS — R519 Headache, unspecified: Secondary | ICD-10-CM

## 2016-01-07 LAB — BASIC METABOLIC PANEL WITH GFR
Anion gap: 8 (ref 5–15)
BUN: 6 mg/dL (ref 6–20)
CO2: 29 mmol/L (ref 22–32)
Calcium: 9 mg/dL (ref 8.9–10.3)
Chloride: 101 mmol/L (ref 101–111)
Creatinine, Ser: 0.91 mg/dL (ref 0.44–1.00)
GFR calc Af Amer: >60 mL/min
GFR calc non Af Amer: >60 mL/min
Glucose, Bld: 94 mg/dL (ref 65–99)
Potassium: 4.2 mmol/L (ref 3.5–5.1)
Sodium: 138 mmol/L (ref 135–145)

## 2016-01-07 LAB — CBC WITH DIFFERENTIAL/PLATELET
Basophils Absolute: 0 K/uL (ref 0.0–0.1)
Basophils Relative: 0 %
Eosinophils Absolute: 0.1 K/uL (ref 0.0–0.7)
Eosinophils Relative: 1 %
HCT: 46.9 % — ABNORMAL HIGH (ref 36.0–46.0)
Hemoglobin: 15.6 g/dL — ABNORMAL HIGH (ref 12.0–15.0)
Lymphocytes Relative: 25 %
Lymphs Abs: 2.8 K/uL (ref 0.7–4.0)
MCH: 29.3 pg (ref 26.0–34.0)
MCHC: 33.3 g/dL (ref 30.0–36.0)
MCV: 88.2 fL (ref 78.0–100.0)
Monocytes Absolute: 0.7 K/uL (ref 0.1–1.0)
Monocytes Relative: 6 %
Neutro Abs: 7.8 K/uL — ABNORMAL HIGH (ref 1.7–7.7)
Neutrophils Relative %: 68 %
Platelets: 272 K/uL (ref 150–400)
RBC: 5.32 MIL/uL — ABNORMAL HIGH (ref 3.87–5.11)
RDW: 14.5 % (ref 11.5–15.5)
WBC: 11.5 K/uL — ABNORMAL HIGH (ref 4.0–10.5)

## 2016-01-07 MED ORDER — DIPHENHYDRAMINE HCL 50 MG/ML IJ SOLN
12.5000 mg | Freq: Once | INTRAMUSCULAR | Status: AC
  Administered 2016-01-07: 12.5 mg via INTRAVENOUS
  Filled 2016-01-07: qty 1

## 2016-01-07 MED ORDER — KETOROLAC TROMETHAMINE 30 MG/ML IJ SOLN
30.0000 mg | Freq: Once | INTRAMUSCULAR | Status: AC
  Administered 2016-01-07: 30 mg via INTRAVENOUS
  Filled 2016-01-07: qty 1

## 2016-01-07 MED ORDER — DEXAMETHASONE SODIUM PHOSPHATE 10 MG/ML IJ SOLN
10.0000 mg | Freq: Once | INTRAMUSCULAR | Status: AC
  Administered 2016-01-07: 10 mg via INTRAVENOUS
  Filled 2016-01-07: qty 1

## 2016-01-07 MED ORDER — METOCLOPRAMIDE HCL 5 MG/ML IJ SOLN
10.0000 mg | Freq: Once | INTRAMUSCULAR | Status: AC
  Administered 2016-01-07: 10 mg via INTRAVENOUS
  Filled 2016-01-07: qty 2

## 2016-01-07 MED ORDER — CLONIDINE HCL 0.2 MG PO TABS
0.2000 mg | ORAL_TABLET | Freq: Once | ORAL | Status: AC
  Administered 2016-01-07: 0.2 mg via ORAL
  Filled 2016-01-07: qty 1

## 2016-01-07 MED ORDER — SODIUM CHLORIDE 0.9 % IV BOLUS (SEPSIS)
1000.0000 mL | Freq: Once | INTRAVENOUS | Status: AC
  Administered 2016-01-07: 1000 mL via INTRAVENOUS

## 2016-01-07 MED ORDER — IPRATROPIUM-ALBUTEROL 0.5-2.5 (3) MG/3ML IN SOLN
3.0000 mL | Freq: Four times a day (QID) | RESPIRATORY_TRACT | Status: DC
  Administered 2016-01-07: 3 mL via RESPIRATORY_TRACT
  Filled 2016-01-07: qty 3

## 2016-01-07 NOTE — ED Notes (Signed)
Pt here with complaints of H/A x 2 days.  Pt reports just starting taking BP med again, but hasn't taken anything for H/A.

## 2016-01-07 NOTE — ED Provider Notes (Signed)
CSN: [id number]     Arrival date & time 01/07/16  1206 History   First MD Initiated Contact with Patient 01/07/16 1317     Chief Complaint  Patient presents with  . Headache     (Consider location/radiation/quality/duration/timing/severity/associated sxs/prior Treatment) HPI   Rachael Rogers is a 40 y.o F, with a pmhx of HTN, obesity, asthma who presents to the ED today c/o headache and high blood pressure. Patient states that 3 days ago he developed a left-sided headache that he associated with a sinus headache as he has been getting over a cold lately. He was seen by his PCP 2 days ago who prescribed him Claritin for this which has not provided him relief. Patient states that today he developed blurry vision in his left eye that is intermittent in nature. He endorses photophobia and phonophobia. Patient has history of headaches which he states are similar to the one he is experiencing now. Patient also states that he has been checking his blood pressure at home which is been higher than normal. He states that he just had his amlodipine filled 2 days ago after seeing his PCP which she took today. Denies chest pain, shortness of breath, dizziness, weakness, vomiting.     Past Medical History  Diagnosis Date  . Asthma   . Sleep apnea 2011  . Heart murmur 1990  . Hypertension   . Arthritis of knee    Past Surgical History  Procedure Laterality Date  . Appendectomy  1996   Family History  Problem Relation Age of Onset  . Asthma Father   . Heart failure Mother   . Hyperlipidemia Mother   . Hyperlipidemia Father   . Hypertension Mother   . Hypertension Father   . Stroke Maternal Grandmother    Social History  Substance Use Topics  . Smoking status: Current Every Day Smoker -- 0.30 packs/day for 25 years    Types: Cigarettes  . Smokeless tobacco: Never Used  . Alcohol Use: Yes     Comment: Hx binge drinking - has cut down   OB History    No data available     Review of  Systems  All other systems reviewed and are negative.     Allergies  Food and Theophyllines  Home Medications   Prior to Admission medications   Medication Sig Start Date End Date Taking? Authorizing Provider  albuterol (PROVENTIL) (2.5 MG/3ML) 0.083% nebulizer solution Take 3 mLs (2.5 mg total) by nebulization every 6 (six) hours as needed for wheezing. 08/29/14 08/29/15  Smitty Cords, DO  Albuterol Sulfate (PROAIR HFA IN) Inhale 2 puffs into the lungs as directed.     Historical Provider, MD  Fluticasone Propionate HFA (FLOVENT HFA IN) Inhale into the lungs as directed.    Historical Provider, MD  furosemide (LASIX) 40 MG tablet Take 0.5 tablets (20 mg total) by mouth daily as needed. For lower extremity edema 07/22/15   Lars Masson, MD  loratadine (CLARITIN) 10 MG tablet Take 1 tablet (10 mg total) by mouth daily. 01/05/16   Smitty Cords, DO  omeprazole (PRILOSEC) 20 MG capsule Take 1 capsule (20 mg total) by mouth daily. 30 min before first meal. 01/05/16   Smitty Cords, DO  testosterone cypionate (DEPOTESTOTERONE CYPIONATE) 200 MG/ML injection Inject 200 mg into the muscle every 14 (fourteen) days. 08/03/14   Historical Provider, MD   BP 192/105 mmHg  Pulse 70  Temp(Src) 99.3 F (37.4 C) (Oral)  Resp 18  Ht 5\' 2"  (1.575 m)  Wt 151.501 kg  BMI 61.07 kg/m2  SpO2 98% Physical Exam  Constitutional: She is oriented to person, place, and time. She appears well-developed and well-nourished. No distress.  HENT:  Head: Normocephalic and atraumatic.  Nose: Nose normal.  Mouth/Throat: Oropharynx is clear and moist. No oropharyngeal exudate.  Eyes: Conjunctivae and EOM are normal. Pupils are equal, round, and reactive to light. Right eye exhibits no discharge. Left eye exhibits no discharge. No scleral icterus.  Visual acuity 20/25 bilaterally. No visual field deficits.  Neck: Neck supple. No JVD present.  Cardiovascular: Normal rate, regular  rhythm, normal heart sounds and intact distal pulses.  Exam reveals no gallop and no friction rub.   No murmur heard. Pulmonary/Chest: Effort normal and breath sounds normal. No respiratory distress. She has no wheezes. She has no rales. She exhibits no tenderness.  Abdominal: Soft. She exhibits no distension. There is no tenderness. There is no guarding.  Large ventral hernia.  Musculoskeletal: Normal range of motion. She exhibits no edema.  Lymphadenopathy:    She has no cervical adenopathy.  Neurological: She is alert and oriented to person, place, and time. No cranial nerve deficit. She exhibits normal muscle tone.  Strength 5/5 throughout. No sensory deficits.  No dysmetria. Normal finger to nose. No gait abnormality. No facial droop. No slurred speech. No pronator drift.  Skin: Skin is warm and dry. No rash noted. She is not diaphoretic. No erythema. No pallor.  Psychiatric: She has a normal mood and affect. Her behavior is normal.  Nursing note and vitals reviewed.   ED Course  Procedures (including critical care time) Labs Review Labs Reviewed  CBC WITH DIFFERENTIAL/PLATELET - Abnormal; Notable for the following:    WBC 11.5 (*)    RBC 5.32 (*)    Hemoglobin 15.6 (*)    HCT 46.9 (*)    Neutro Abs 7.8 (*)    All other components within normal limits  BASIC METABOLIC PANEL    Imaging Review No results found. I have personally reviewed and evaluated these images and lab results as part of my medical decision-making.   EKG Interpretation None      MDM   Final diagnoses:  Nonintractable episodic headache, unspecified headache type  Essential hypertension   40 year old female presents with headache onset 3 days ago and elevated blood pressure. Patient has history of migraines and this similar to that. He reports that he has had intermittent blurry vision in his left eye. Visual acuity today is equal bilaterally. No neurological deficits on exam. Patient saw his PCP 2  days ago for same symptoms. Initial BP is elevated 201/120. Per PCP note, patient has hx of noncompliance with blood pressure medication as he feels that he can control it at home with his diet. Patient states he just retaking his amlodipine again 2 days ago after he refilled his prescription. He had been off of his BP medications for over one month. 0.2 Catapres given in addition to migraine cocktail including Reglan, Toradol, Decadron and Benadryl. Upon repeat exam, patient states that his migraine is completely resolved. Repeat blood pressure is 172/110. All other VSS. No CP, SOB, carotid bruits. Discussed with patient importance of compliance with his BP medications who expresses understanding. Pt will follow up with PCP for medication adjustment and re-evaluation. No evidence of end organ damage on blood work. STRICT return precautions given. Case discussed with Dr. Judd Lienelo who agrees with treatment plan.  Medications  sodium chloride 0.9 %  bolus 1,000 mL (0 mLs Intravenous Stopped 01/07/16 1627)  metoCLOPramide (REGLAN) injection 10 mg (10 mg Intravenous Given 01/07/16 1405)  ketorolac (TORADOL) 30 MG/ML injection 30 mg (30 mg Intravenous Given 01/07/16 1404)  dexamethasone (DECADRON) injection 10 mg (10 mg Intravenous Given 01/07/16 1404)  diphenhydrAMINE (BENADRYL) injection 12.5 mg (12.5 mg Intravenous Given 01/07/16 1405)  cloNIDine (CATAPRES) tablet 0.2 mg (0.2 mg Oral Given 01/07/16 1421)  '     Lester Kinsman [place], PA-C 01/08/16 1737  Geoffery Lyons, MD 01/11/16 1440

## 2016-01-07 NOTE — Discharge Instructions (Signed)
General Headache Without Cause A headache is pain or discomfort felt around the head or neck area. The specific cause of a headache may not be found. There are many causes and types of headaches. A few common ones are:  Tension headaches.  Migraine headaches.  Cluster headaches.  Chronic daily headaches. HOME CARE INSTRUCTIONS  Watch your condition for any changes. Take these steps to help with your condition: Managing Pain  Take over-the-counter and prescription medicines only as told by your health care provider.  Lie down in a dark, quiet room when you have a headache.  If directed, apply ice to the head and neck area:  Put ice in a plastic bag.  Place a towel between your skin and the bag.  Leave the ice on for 20 minutes, 2-3 times per day.  Use a heating pad or hot shower to apply heat to the head and neck area as told by your health care provider.  Keep lights dim if bright lights bother you or make your headaches worse. Eating and Drinking  Eat meals on a regular schedule.  Limit alcohol use.  Decrease the amount of caffeine you drink, or stop drinking caffeine. General Instructions  Keep all follow-up visits as told by your health care provider. This is important.  Keep a headache journal to help find out what may trigger your headaches. For example, write down:  What you eat and drink.  How much sleep you get.  Any change to your diet or medicines.  Try massage or other relaxation techniques.  Limit stress.  Sit up straight, and do not tense your muscles.  Do not use tobacco products, including cigarettes, chewing tobacco, or e-cigarettes. If you need help quitting, ask your health care provider.  Exercise regularly as told by your health care provider.  Sleep on a regular schedule. Get 7-9 hours of sleep, or the amount recommended by your health care provider. SEEK MEDICAL CARE IF:   Your symptoms are not helped by medicine.  You have a  headache that is different from the usual headache.  You have nausea or you vomit.  You have a fever. SEEK IMMEDIATE MEDICAL CARE IF:   Your headache becomes severe.  You have repeated vomiting.  You have a stiff neck.  You have a loss of vision.  You have problems with speech.  You have pain in the eye or ear.  You have muscular weakness or loss of muscle control.  You lose your balance or have trouble walking.  You feel faint or pass out.  You have confusion.   This information is not intended to replace advice given to you by your health care provider. Make sure you discuss any questions you have with your health care provider.   Document Released: 10/03/2005 Document Revised: 06/24/2015 Document Reviewed: 01/26/2015 Elsevier Interactive Patient Education 2016 [employer].  Hypertension Hypertension, commonly called high blood pressure, is when the force of blood pumping through your arteries is too strong. Your arteries are the blood vessels that carry blood from your heart throughout your body. A blood pressure reading consists of a higher number over a lower number, such as 110/72. The higher number (systolic) is the pressure inside your arteries when your heart pumps. The lower number (diastolic) is the pressure inside your arteries when your heart relaxes. Ideally you want your blood pressure below 120/80. Hypertension forces your heart to work harder to pump blood. Your arteries may become narrow or stiff. Having untreated or  uncontrolled hypertension can cause heart attack, stroke, kidney disease, and other problems. °RISK FACTORS °Some risk factors for high blood pressure are controllable. Others are not.  °Risk factors you cannot control include:  °· Race. You may be at higher risk if you are African American. °· Age. Risk increases with age. °· Gender. Men are at higher risk than women before age 45 years. After age 65, women are at higher risk than men. °Risk factors  you can control include: °· Not getting enough exercise or physical activity. °· Being overweight. °· Getting too much fat, sugar, calories, or salt in your diet. °· Drinking too much alcohol. °SIGNS AND SYMPTOMS °Hypertension does not usually cause signs or symptoms. Extremely high blood pressure (hypertensive crisis) may cause headache, anxiety, shortness of breath, and nosebleed. °DIAGNOSIS °To check if you have hypertension, your health care provider will measure your blood pressure while you are seated, with your arm held at the level of your heart. It should be measured at least twice using the same arm. Certain conditions can cause a difference in blood pressure between your right and left arms. A blood pressure reading that is higher than normal on one occasion does not mean that you need treatment. If it is not clear whether you have high blood pressure, you may be asked to return on a different day to have your blood pressure checked again. Or, you may be asked to monitor your blood pressure at home for 1 or more weeks. °TREATMENT °Treating high blood pressure includes making lifestyle changes and possibly taking medicine. Living a healthy lifestyle can help lower high blood pressure. You may need to change some of your habits. °Lifestyle changes may include: °· Following the DASH diet. This diet is high in fruits, vegetables, and whole grains. It is low in salt, red meat, and added sugars. °· Keep your sodium intake below 2,300 mg per day. °· Getting at least 30-45 minutes of aerobic exercise at least 4 times per week. °· Losing weight if necessary. °· Not smoking. °· Limiting alcoholic beverages. °· Learning ways to reduce stress. °Your health care provider may prescribe medicine if lifestyle changes are not enough to get your blood pressure under control, and if one of the following is true: °· You are 18-59 years of age and your systolic blood pressure is above 140. °· You are 60 years of age or older,  and your systolic blood pressure is above 150. °· Your diastolic blood pressure is above 90. °· You have diabetes, and your systolic blood pressure is over 140 or your diastolic blood pressure is over 90. °· You have kidney disease and your blood pressure is above 140/90. °· You have heart disease and your blood pressure is above 140/90. °Your personal target blood pressure may vary depending on your medical conditions, your age, and other factors. °HOME CARE INSTRUCTIONS °· Have your blood pressure rechecked as directed by your health care provider.   °· Take medicines only as directed by your health care provider. Follow the directions carefully. Blood pressure medicines must be taken as prescribed. The medicine does not work as well when you skip doses. Skipping doses also puts you at risk for problems. °· Do not smoke.   °· Monitor your blood pressure at home as directed by your health care provider.  °SEEK MEDICAL CARE IF:  °· You think you are having a reaction to medicines taken. °· You have recurrent headaches or feel dizzy. °· You have swelling in your   ankles.  You have trouble with your vision. SEEK IMMEDIATE MEDICAL CARE IF:  You develop a severe headache or confusion.  You have unusual weakness, numbness, or feel faint.  You have severe chest or abdominal pain.  You vomit repeatedly.  You have trouble breathing. MAKE SURE YOU:   Understand these instructions.  Will watch your condition.  Will get help right away if you are not doing well or get worse.   This information is not intended to replace advice given to you by your health care provider. Make sure you discuss any questions you have with your health care provider.  Take blood pressure medication as prescribed. Encourage heart healthy diet. Take ibuprofen as needed for headache. Return to the emergency department if you experience severe worsening of your symptoms, blurry vision, vomiting, chest pain, difficulty breathing.  Follow up with your primary care doctor for medication adjustment.

## 2016-01-07 NOTE — ED Notes (Signed)
Pt to department via EMS- pt reports headache that started a couple of days ago. Was recently seen at PCP office for the same and was hypertensive there. Took BP medications last night. Bp-200/110 Hr-80

## 2016-01-08 ENCOUNTER — Telehealth: Payer: Self-pay | Admitting: Family Medicine

## 2016-01-08 DIAGNOSIS — I10 Essential (primary) hypertension: Secondary | ICD-10-CM

## 2016-01-08 MED ORDER — LISINOPRIL-HYDROCHLOROTHIAZIDE 20-25 MG PO TABS: 1.0000 | ORAL_TABLET | Freq: Every day | ORAL | Status: DC

## 2016-01-08 NOTE — Telephone Encounter (Signed)
Called patient back. Rachael HesselbachMaria described her trip to the ED. See notes. Asks about BP meds. Currently only has Amlodipine 10mg , taking daily. Has some Lasix 40mg  tabs, only taking PRN.  Chart review, showed Cardiology Dr Delton SeeNelson DC'd HCTZ and started Lasix 40mg  daily back in 05/2015 for edema. Now patient is not taking Lasix daily due to urinary frequency, has not been taking daily for many months.  Plan now, advised to take Lasix 20mg  (half tab) and Amlodipine 10mg  daily through weekend. [facility] not [facility] on weekends. Sent in new rx HCTZ-Lisinopril 25-20mg  take 1 daily, start Monday once able to obtain. Then may DC lasix and only use PRN swelling.  Follow-up as needed, monitor BP, return for nurse BP check at convenience within next few weeks.  Saralyn PilarAlexander Shykeem Resurreccion, DO [facility], PGY-3

## 2016-01-08 NOTE — Telephone Encounter (Signed)
Pt went to ED yesterday because of her pressure. It was over 200 when she went. Pt wants to know if dr is going to give her another medication, double the one she is taking. She would like to talk to Dr Althea CharonKaramalegos about this.

## 2016-02-02 ENCOUNTER — Telehealth: Payer: Self-pay | Admitting: Family Medicine

## 2016-02-02 DIAGNOSIS — M25562 Pain in left knee: Secondary | ICD-10-CM

## 2016-02-02 DIAGNOSIS — M1711 Unilateral primary osteoarthritis, right knee: Secondary | ICD-10-CM

## 2016-02-02 DIAGNOSIS — M25561 Pain in right knee: Secondary | ICD-10-CM

## 2016-02-02 NOTE — Telephone Encounter (Signed)
Patient calling to say the tylenol #3 tabs are not helping with her pain.  Need something else provided.  Please contact patient to advise.

## 2016-02-02 NOTE — Telephone Encounter (Signed)
Last saw patient 01/05/16 for variety of complaints (allergies, BP, abdominal pain/GERD, left ankle sprain) also recently seen in ED 01/07/16 for headaches and HTN. I did not prescribe the patient Tylenol #3 (with codeine) or any pain medicine at that time, I did advise her to take Tylenol and/or Ibuprofen as needed for variety of issues.  Also checked [place] controlled substance database and do not see Tylenol #3 or any opiate pain medicines prescribed for this patient. No pain medicines rx in ED on 01/07/16 either.  Could you call patient back to clarify the following: 1. What kind of pain is it? Where is it located? 2. Clarify which medication and dosage was taken, who prescribed if it was Tylenol #3 w/ codeine  Saralyn PilarAlexander Karamalegos, DO [facility], PGY-3

## 2016-02-12 ENCOUNTER — Encounter: Payer: Self-pay | Admitting: Family Medicine

## 2016-02-12 DIAGNOSIS — M1711 Unilateral primary osteoarthritis, right knee: Secondary | ICD-10-CM | POA: Insufficient documentation

## 2016-02-12 MED ORDER — ACETAMINOPHEN-CODEINE #3 300-30 MG PO TABS: 1.0000 | ORAL_TABLET | Freq: Four times a day (QID) | ORAL | Status: DC | PRN

## 2016-02-12 NOTE — Addendum Note (Signed)
Addended by: Smitty CordsKARAMALEGOS, ALEXANDER J on: 02/12/2016 02:47 PM   Modules accepted: Orders

## 2016-02-12 NOTE — Telephone Encounter (Signed)
Pt informed rx sent in but no refill until seen in the office. Deseree Bruna PotterBlount, CMA

## 2016-02-12 NOTE — Telephone Encounter (Signed)
Spoke with patient and she said that she is having knee pain due to her arthritis and the rain isnt making it better. She said that you prescribed it to her at the last visit. i dont see it on her med list or office note. Please advise. Deseree Bruna PotterBlount, CMA

## 2016-02-12 NOTE — Telephone Encounter (Signed)
Reviewed chart, she does have chronic history of bilateral knee OA, last imaging 2014 standing knee x-rays did show some mild DJD, especially R > L knee. She currently rides [employer] bus for OA. I see documented that she has taken Tylenol #3 before for OA, but I still cannot find actual rx.  Due to patient having significant limitations on ability to follow-up in clinic, I will proceed to phone in Tylenol #3 acetaminophen-codeine 300-30mg  tabs, take 1-2 tabs q 6 hr PRN mod/severe pain #60 tabs with 0 refills, [facility]. They will notify patient when ready.  Please relay this message to patient, did not print rx so does not need to come by our office to pick up anything, rx will be at the pharmacy.  Saralyn PilarAlexander Karamalegos, DO [facility], PGY-3

## 2016-05-16 ENCOUNTER — Other Ambulatory Visit: Payer: Self-pay | Admitting: Family Medicine

## 2016-05-16 DIAGNOSIS — I10 Essential (primary) hypertension: Secondary | ICD-10-CM

## 2016-05-16 DIAGNOSIS — J4550 Severe persistent asthma, uncomplicated: Secondary | ICD-10-CM

## 2016-05-17 ENCOUNTER — Other Ambulatory Visit: Payer: Self-pay | Admitting: *Deleted

## 2016-05-17 DIAGNOSIS — J4550 Severe persistent asthma, uncomplicated: Secondary | ICD-10-CM

## 2016-05-18 MED ORDER — ALBUTEROL SULFATE HFA 108 (90 BASE) MCG/ACT IN AERS: 2.0000 | INHALATION_SPRAY | Freq: Four times a day (QID) | RESPIRATORY_TRACT | 3 refills | Status: DC | PRN

## 2016-05-24 ENCOUNTER — Ambulatory Visit (HOSPITAL_COMMUNITY)
Admission: EM | Admit: 2016-05-24 | Discharge: 2016-05-24 | Disposition: A | Discharge status: Home / Self Care | Payer: Medicaid Other | Attending: Family Medicine | Admitting: Family Medicine

## 2016-05-24 ENCOUNTER — Encounter (HOSPITAL_COMMUNITY): Payer: Self-pay | Admitting: *Deleted

## 2016-05-24 ENCOUNTER — Encounter (HOSPITAL_COMMUNITY): Payer: Self-pay | Admitting: Emergency Medicine

## 2016-05-24 DIAGNOSIS — M10072 Idiopathic gout, left ankle and foot: Secondary | ICD-10-CM | POA: Diagnosis not present

## 2016-05-24 MED ORDER — METHYLPREDNISOLONE ACETATE 80 MG/ML IJ SUSP
80.0000 mg | Freq: Once | INTRAMUSCULAR | Status: AC
  Administered 2016-05-24: 80 mg via INTRAMUSCULAR

## 2016-05-24 MED ORDER — METHYLPREDNISOLONE ACETATE 80 MG/ML IJ SUSP
INTRAMUSCULAR | Status: AC
  Filled 2016-05-24: qty 1

## 2016-05-24 MED ORDER — PREDNISONE 50 MG PO TABS: 50.0000 mg | ORAL_TABLET | Freq: Every day | ORAL | 0 refills | Status: DC

## 2016-05-24 NOTE — ED Triage Notes (Signed)
[facility] with a complaint of pain and swelling into his great toe on his right foot that started 2 days ago. The patient denied any known injury.

## 2016-05-24 NOTE — ED Provider Notes (Signed)
CSN: [id number]     Arrival date & time 05/24/16  1050 History   First MD Initiated Contact with Patient 05/24/16 1111     Chief Complaint  Patient presents with  . Toe Pain   (Consider location/radiation/quality/duration/timing/severity/associated sxs/prior Treatment) HPI Patient reports that on Sunday his left great toe started to be swollen and painful.  He notes that it limits mobility, increased pain with walking.  He notes that a similar episode happened before but that it resolved on its own.  Has been taking Norco from a dental procedure, this did not help just put him to sleep.  Reports throbbing but no increased heat.  Used ice alternating with a heating pad.  Ice helps, heat makes worse.  Tripped over carpet but did not injure toe.  No fevers, chills, nausea, vomiting.  Sexually active, monogamous.  No h/o STI.  Allergic to theophylline.  Had ribs and pizza the night before.  Past Medical History:  Diagnosis Date  . Arthritis of knee   . Asthma   . Heart murmur 1990  . Hypertension   . Sleep apnea 2011   Past Surgical History:  Procedure Laterality Date  . APPENDECTOMY  1996   Family History  Problem Relation Age of Onset  . Heart failure Mother   . Hyperlipidemia Mother   . Hypertension Mother   . Asthma Father   . Hyperlipidemia Father   . Hypertension Father   . Stroke Maternal Grandmother    Social History  Substance Use Topics  . Smoking status: Current Every Day Smoker    Packs/day: 0.30    Years: 25.00    Types: Cigarettes  . Smokeless tobacco: Never Used  . Alcohol use Yes     Comment: Hx binge drinking - has cut down   OB History    No data available     Review of Systems  Constitutional: Negative for chills, fatigue and fever.  Respiratory: Negative for chest tightness and shortness of breath.   Cardiovascular: Negative for chest pain.  Musculoskeletal: Positive for arthralgias (left great toe), gait problem and joint swelling (left great toe).   Skin: Positive for color change.    Allergies  Food and Theophyllines  Home Medications   Prior to Admission medications   Medication Sig Start Date End Date Taking? Authorizing Provider  albuterol (PROVENTIL HFA;VENTOLIN HFA) 108 (90 Base) MCG/ACT inhaler Inhale 2 puffs into the lungs every 6 (six) hours as needed for wheezing. 05/18/16  Yes Marquette Saa, MD  albuterol (PROVENTIL) (2.5 MG/3ML) 0.083% nebulizer solution INHALE VIA NEBULIZER EVERY 6 HOURS AS NEEDED FOR WHEEZING 05/16/16  Yes Marquette Saa, MD  Albuterol Sulfate (PROAIR HFA IN) Inhale 2 puffs into the lungs every 4 (four) hours as needed (for shortness of breath or wheezing).    Yes Historical Provider, MD  fluticasone (FLOVENT HFA) 44 MCG/ACT inhaler Inhale 2 puffs into the lungs as needed (for shortness of breath).   Yes Historical Provider, MD  hydrochlorothiazide (MICROZIDE) 12.5 MG capsule TAKE ONE CAPSULE BY MOUTH DAILY 05/16/16  Yes Marquette Saa, MD  lisinopril-hydrochlorothiazide (PRINZIDE,ZESTORETIC) 20-25 MG tablet Take 1 tablet by mouth daily. 01/08/16  Yes Alexander Fredric Mare, DO  omeprazole (PRILOSEC) 20 MG capsule Take 1 capsule (20 mg total) by mouth daily. 30 min before first meal. 01/05/16  Yes Smitty Cords, DO  testosterone cypionate (DEPOTESTOTERONE CYPIONATE) 200 MG/ML injection Inject 200 mg into the muscle every 14 (fourteen) days. 08/03/14  Yes Historical  Provider, MD  acetaminophen-codeine (TYLENOL #3) 300-30 MG tablet Take 1-2 tablets by mouth every 6 (six) hours as needed for moderate pain or severe pain. Patient not taking: Reported on 05/16/2016 02/12/16   Smitty CordsAlexander J Karamalegos, DO  furosemide (LASIX) 40 MG tablet Take 0.5 tablets (20 mg total) by mouth daily as needed. For lower extremity edema Patient not taking: Reported on 05/16/2016 07/22/15   Lars MassonKatarina H Nelson, MD  loratadine (CLARITIN) 10 MG tablet Take 1 tablet (10 mg total) by mouth  daily. Patient not taking: Reported on 05/16/2016 01/05/16   Smitty CordsAlexander J Karamalegos, DO  predniSONE (DELTASONE) 50 MG tablet Take 1 tablet (50 mg total) by mouth daily with breakfast. x3 days (start 05/25/16) 05/25/16   Raliegh IpAshly M Aryka Coonradt, DO   Meds Ordered and Administered this Visit   Medications  methylPREDNISolone acetate (DEPO-MEDROL) injection 80 mg (not administered)    BP (!) 194/133 (BP Location: Right Wrist) Comment: Has not taken HTN meds  Pulse 82   Temp 98.6 F (37 C) (Oral)   Resp 20   SpO2 98%  No data found.   Physical Exam  Constitutional: She appears well-developed.  obese  Musculoskeletal: She exhibits edema and tenderness.  Left great MTP joint with moderate edema and erythema.  No increased warmth.  Decreased ROM in the setting of edema.  +TTP to MTP.  No other affected joints.  Skin: She is not diaphoretic. There is erythema.  Nursing note and vitals reviewed.   Urgent Care Course   Clinical Course    Procedures (including critical care time)  Labs Review Labs Reviewed - No data to display  Imaging Review No results found.   MDM   1. Acute idiopathic gout of left foot    Rachael Rogers is a 40 y.o. female who identifies as female that [facility] and pain in his left great toe.  His exam is remarkable for decreased ROM secondary to swelling and pain.  Erythema surrounding the MTP joint of the left great toe.  No increased warmth.  Patient appears to have a gout flare.  Septic joint was considered.  Patient is sexually active with one partner but denies h/o STIs.  Considered obtaining a uric acid level but do not think that this would change my management at this time.  Patient scheduled to have EGD performed tomorrow.  Discussed giving colchicine vs steroid risk vs benefit.  Solumedrol 80mg  IM administered in office and patient discharged with Prednisone 50mg  burst x3 days.  Strict return precautions reviewed with patient.  Patient to seek  immediate medical attention if develops fevers, chills, nausea, vomiting, worsening pain.  Patient also advised to discuss new medication with GI provider.  If persistent pain/ swelling would consider aspiration with culture to evaluate for septic joint.  Meds ordered this encounter  Medications  . methylPREDNISolone acetate (DEPO-MEDROL) injection 80 mg  . predniSONE (DELTASONE) 50 MG tablet    Sig: Take 1 tablet (50 mg total) by mouth daily with breakfast. x3 days (start 05/25/16)    Dispense:  3 tablet    Refill:  0   Kreed Kauffman M. Nadine CountsGottschalk, DO PGY-3, [facility]       Raliegh IpAshly M Kentley Blyden, DO 05/24/16 1144

## 2016-05-24 NOTE — Progress Notes (Addendum)
Mr. Rachael Rogers was seen at Urgent Care and diagnosed with Gout.  Blood Pressure at Urgent Care was 190/143. Patient reported that he had not taken blood pressure  Medication at that time.  Mr Rachael Rogers said he has now taken his blood pressure medication and wife will monitor it later today.

## 2016-05-25 ENCOUNTER — Ambulatory Visit (HOSPITAL_COMMUNITY): Admission: RE | Admit: 2016-05-25 | Payer: Medicaid Other | Source: Ambulatory Visit | Admitting: Gastroenterology

## 2016-05-25 ENCOUNTER — Encounter (HOSPITAL_COMMUNITY): Payer: Self-pay | Admitting: Certified Registered Nurse Anesthetist

## 2016-05-25 ENCOUNTER — Other Ambulatory Visit: Payer: Self-pay | Admitting: Gastroenterology

## 2016-05-25 HISTORY — DX: Family history of other specified conditions: Z84.89

## 2016-05-25 HISTORY — DX: Unspecified injury of head, initial encounter: S09.90XA

## 2016-05-25 SURGERY — ESOPHAGOGASTRODUODENOSCOPY (EGD) WITH PROPOFOL
Anesthesia: Monitor Anesthesia Care

## 2016-06-01 ENCOUNTER — Ambulatory Visit (INDEPENDENT_AMBULATORY_CARE_PROVIDER_SITE_OTHER): Payer: Medicaid Other | Admitting: Family Medicine

## 2016-06-01 ENCOUNTER — Other Ambulatory Visit: Payer: Self-pay | Admitting: Student-PharmD

## 2016-06-01 ENCOUNTER — Ambulatory Visit: Payer: Medicaid Other | Admitting: Internal Medicine

## 2016-06-01 VITALS — BP 97/75 | HR 137 | Temp 98.5°F | Wt 311.0 lb

## 2016-06-01 DIAGNOSIS — M79672 Pain in left foot: Secondary | ICD-10-CM | POA: Diagnosis present

## 2016-06-01 DIAGNOSIS — I10 Essential (primary) hypertension: Secondary | ICD-10-CM | POA: Diagnosis not present

## 2016-06-01 DIAGNOSIS — K219 Gastro-esophageal reflux disease without esophagitis: Secondary | ICD-10-CM | POA: Diagnosis not present

## 2016-06-01 DIAGNOSIS — Z79899 Other long term (current) drug therapy: Secondary | ICD-10-CM

## 2016-06-01 MED ORDER — MELOXICAM 15 MG PO TABS: 15.0000 mg | ORAL_TABLET | Freq: Every day | ORAL | 0 refills | Status: DC

## 2016-06-01 MED ORDER — OMEPRAZOLE 20 MG PO CPDR: 20.0000 mg | DELAYED_RELEASE_CAPSULE | Freq: Every day | ORAL | 1 refills | Status: DC

## 2016-06-01 NOTE — Assessment & Plan Note (Addendum)
Patient is here with complaints of persistent left foot pain. Diagnosed with gout at urgent care. No x-rays obtained at that time. Due to the acute nature of this event especially following a forced flexion event described by the patient there is some concern for metatarsal fracture versus avulsion fracture. Also, gout is a strong possibility as patient is on 2 thiazide diuretic medication prescriptions. And patient endorses good compliance with both. - X-ray left foot to be obtained. Will contact patient with results. - Meloxicam once daily; to continue treatment for acute gouty flare.  - We'll likely need to transition to allopurinol once flare has dissipated (assuming x-rays are negative) - Discontinued hydrochlorothiazide 12.5 mg for reasons stated below. - Reordered PPI due to risk of gastritis with NSAID and previous treatment with steroids.

## 2016-06-01 NOTE — Patient Instructions (Addendum)
It was a pleasure seeing you today in our clinic. Today we discussed your left foot pain. Here is the treatment plan we have discussed and agreed upon together:   - I would like for you to restart taking Prilosec. Take 1 tablet daily. - I would like for you to begin taking meloxicam. This is an anti-inflammatory. Take it one time a day with food. I would strongly recommend taking it every day for the next 7-10 days and then as needed daily after that. - I would like for you to obtain x-rays of your left foot. I have [facility]. A map of its location is been provided you. - Completely stop taking your hydrochlorothiazide tablet. I would like to have you return in 1 week to see her PCP and have your blood pressure rechecked at that time.

## 2016-06-01 NOTE — Assessment & Plan Note (Signed)
Continue PPI as stated above

## 2016-06-01 NOTE — Assessment & Plan Note (Addendum)
Improved/overtreated: Patient is hypotensive and tachycardic today on exam.  - Discontinuing HCTZ 12.5 mg; this should help raise his blood pressure and reduce his pulse. Also, thiazide diuretics can induce gouty flares. Formal diagnosis of gout has not been made at this time but if patient is currently overtreated with thiazide diuretics it is not unlikely for this to be the cause of this flare. - Follow-up in one week with PCP for BP check

## 2016-06-01 NOTE — Progress Notes (Signed)
HPI  CC: Left great toe pain Patient is here with complaints of left sided great toe pain. He states that about 10 days ago he was walking in his great toe got caught on the carpet. Toe was pulled back and he had immediate pain. The following day he noticed significant swelling and redness over the first metatarsophalangeal joint and pain along the first metatarsal and first phalanx. Pain has persisted since that time. He was seen in urgent care on 05/24/16 and was diagnosed with an acute gouty flare of this joint. He was prescribed IM Solu-Medrol and a 3 day prednisone burst. Pain is improved minimally but persists. Joint is painful with any and all range of motion of the first metatarsophalangeal joint. Patient has some difficulty with walking. Denies fever, chills, nausea, vomiting, diarrhea, paresthesias, numbness, or weakness.  Hypotension: Patient noted to be slightly hypotensive on exam. Currently asymptomatic. Tachycardia present currently. Endorses good compliance with hydrochlorothiazide 12.5 mg, and lisinopril-hydrochlorothiazide 20-25 mg. Patient is NOT taking his Lasix.  Review of Systems   See HPI for ROS. All other systems reviewed and are negative.  CC, SH/smoking status, and VS noted  Objective: BP 97/75 (BP Location: Right Wrist, Patient Position: Sitting, Cuff Size: Normal)   Pulse (!) 137   Temp 98.5 F (36.9 C) (Oral)   Wt (!) 311 lb (141.1 kg)   SpO2 100%   BMI 56.88 kg/m  Gen: NAD, alert, cooperative. CV: Well-perfused. Resp: Non-labored. Neuro: Sensation intact throughout. Foot, left: Notable erythema and swelling at the first metatarsophalangeal joint. TTP along the distal first metatarsal shaft and first proximal phalanx. Range of motion is reduced with flexion and extension of the great toe, otherwise full throughout. Strength is 5/5 but somewhat reduced in the first phalanx secondary to pain. Stable lateral and medial ligaments; squeeze test; Talar dome  nontender; No pain at base of 5th MT; No tenderness over cuboid; No tenderness over N spot or navicular prominence. No tenderness on posterior aspects of lateral and medial malleolus. No sign of peroneal tendon subluxations; Able to walk 4 steps with slightly altered gait.    Assessment and plan:  Foot pain, left Patient is here with complaints of persistent left foot pain. Diagnosed with gout at urgent care. No x-rays obtained at that time. Due to the acute nature of this event especially following a forced flexion event described by the patient there is some concern for metatarsal fracture versus avulsion fracture. Also, gout is a strong possibility as patient is on 2 thiazide diuretic medication prescriptions. And patient endorses good compliance with both. - X-ray left foot to be obtained. Will contact patient with results. - Meloxicam once daily; to continue treatment for acute gouty flare.  - We'll likely need to transition to allopurinol once flare has dissipated (assuming x-rays are negative) - Discontinued hydrochlorothiazide 12.5 mg for reasons stated below. - Reordered PPI due to risk of gastritis with NSAID and previous treatment with steroids.  GERD (gastroesophageal reflux disease) Continue PPI as stated above  Hypertension Improved/overtreated: Patient is hypotensive and tachycardic today on exam.  - Discontinuing HCTZ 12.5 mg; this should help raise his blood pressure and reduce his pulse. Also, thiazide diuretics can induce gouty flares. Formal diagnosis of gout has not been made at this time but if patient is currently overtreated with thiazide diuretics it is not unlikely for this to be the cause of this flare. - Follow-up in one week with PCP for BP check   Orders Placed  This Encounter  Procedures  . DG Foot Complete Left    Standing Status:   Future    Standing Expiration Date:   08/01/2017    Order Specific Question:   Reason for Exam (SYMPTOM  OR DIAGNOSIS  REQUIRED)    Answer:   1st metatarsophalangeal Joint pain    Order Specific Question:   Is the patient pregnant?    Answer:   No    Order Specific Question:   Preferred imaging location?    Answer:   [facility]  . BASIC METABOLIC PANEL WITH GFR    Meds ordered this encounter  Medications  . omeprazole (PRILOSEC) 20 MG capsule    Sig: Take 1 capsule (20 mg total) by mouth daily. 30 min before first meal.    Dispense:  30 capsule    Refill:  1  . meloxicam (MOBIC) 15 MG tablet    Sig: Take 1 tablet (15 mg total) by mouth daily.    Dispense:  30 tablet    Refill:  0     Kathee DeltonIan D McKeag, MD,MS,  PGY3 06/01/2016 6:07 PM

## 2016-06-02 ENCOUNTER — Telehealth: Payer: Self-pay | Admitting: Family Medicine

## 2016-06-02 ENCOUNTER — Ambulatory Visit
Admission: RE | Admit: 2016-06-02 | Discharge: 2016-06-02 | Disposition: A | Discharge status: Home / Self Care | Payer: Medicaid Other | Source: Ambulatory Visit | Attending: Family Medicine | Admitting: Family Medicine

## 2016-06-02 DIAGNOSIS — M79672 Pain in left foot: Secondary | ICD-10-CM

## 2016-06-02 LAB — BASIC METABOLIC PANEL WITH GFR
BUN: 25 mg/dL (ref 7–25)
CHLORIDE: 98 mmol/L (ref 98–110)
CO2: 25 mmol/L (ref 20–31)
CREATININE: 1.13 mg/dL — AB (ref 0.50–1.10)
Calcium: 10 mg/dL (ref 8.6–10.2)
GFR, Est African American: 70 mL/min (ref >=60)
GFR, Est Non African American: 61 mL/min (ref >=60)
GLUCOSE: 89 mg/dL (ref 65–99)
Potassium: 4.6 mmol/L (ref 3.5–5.3)
Sodium: 134 mmol/L — ABNORMAL LOW (ref 135–146)

## 2016-06-02 NOTE — Telephone Encounter (Signed)
Called patient to discuss foot radiographs. I agree with radiology interpretation is a do not see any evidence of fracture or avulsion. 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 Informed him to follow through with the treatment plan discussed at clinic with meloxicam and Tylenol for pain. Patient is to follow-up with PCP for long-term gout preventative coverage.  Kathee DeltonIan D McKeag, MD,MS,  PGY3 06/02/2016 2:13 PM

## 2016-06-22 ENCOUNTER — Other Ambulatory Visit: Payer: Self-pay | Admitting: Family Medicine

## 2016-07-04 ENCOUNTER — Ambulatory Visit: Payer: Medicaid Other | Admitting: Internal Medicine

## 2016-07-15 ENCOUNTER — Ambulatory Visit (INDEPENDENT_AMBULATORY_CARE_PROVIDER_SITE_OTHER): Payer: Medicaid Other | Admitting: Internal Medicine

## 2016-07-15 ENCOUNTER — Encounter: Payer: Self-pay | Admitting: Internal Medicine

## 2016-07-15 DIAGNOSIS — I10 Essential (primary) hypertension: Secondary | ICD-10-CM | POA: Diagnosis not present

## 2016-07-15 DIAGNOSIS — J4550 Severe persistent asthma, uncomplicated: Secondary | ICD-10-CM

## 2016-07-15 DIAGNOSIS — L989 Disorder of the skin and subcutaneous tissue, unspecified: Secondary | ICD-10-CM

## 2016-07-15 DIAGNOSIS — K219 Gastro-esophageal reflux disease without esophagitis: Secondary | ICD-10-CM | POA: Diagnosis present

## 2016-07-15 DIAGNOSIS — F419 Anxiety disorder, unspecified: Secondary | ICD-10-CM | POA: Diagnosis not present

## 2016-07-15 MED ORDER — ALPRAZOLAM 1 MG PO TABS: 1.0000 mg | ORAL_TABLET | Freq: Two times a day (BID) | ORAL | 0 refills | Status: DC | PRN

## 2016-07-15 MED ORDER — MELOXICAM 15 MG PO TABS: ORAL_TABLET | ORAL | 3 refills | Status: DC

## 2016-07-15 MED ORDER — OMEPRAZOLE 20 MG PO CPDR: 20.0000 mg | DELAYED_RELEASE_CAPSULE | Freq: Every day | ORAL | 1 refills | Status: DC

## 2016-07-15 MED ORDER — LISINOPRIL-HYDROCHLOROTHIAZIDE 20-25 MG PO TABS: 1.0000 | ORAL_TABLET | Freq: Every day | ORAL | 5 refills | Status: DC

## 2016-07-15 MED ORDER — ALBUTEROL SULFATE HFA 108 (90 BASE) MCG/ACT IN AERS: 2.0000 | INHALATION_SPRAY | Freq: Four times a day (QID) | RESPIRATORY_TRACT | 3 refills | Status: DC | PRN

## 2016-07-15 MED ORDER — ALBUTEROL SULFATE (2.5 MG/3ML) 0.083% IN NEBU
INHALATION_SOLUTION | RESPIRATORY_TRACT | 5 refills | Status: DC
Start: 2016-07-15 — End: 2016-10-21

## 2016-07-15 NOTE — Progress Notes (Signed)
Dr. Natale MilchLancaster requested a Behavioral Health Consult.   Presenting Issue:  Patient experiencing symptoms of anxiety and panic.  Report of symptoms:  Patient has worry and panic. During these panic attacks, patient has heart palpitations, feels nauseous, has racing thoughts, shortness of breath, and sweating. Patient's anxiety occurs but is not limited to situations where he needs to explain his transgender status, or when he believes others are not being accepting.   Duration of CURRENT symptoms:  Symptoms of anxiety and panic have increased in the last 3 months  Age of onset of first mood disturbance:  At least 2 years ago  Impact on function:  Patient's anxiety results in avoidance of activities like going to the gym and spending time with others. He copes by carrying xanax around with him but is concerned about getting too dependent.  Psychiatric History - Diagnoses: Per patient report, previously diagnosed with PTSD, and either bipolar disorder or borderline personality disorder. Patient is unsure of which.  - Hospitalizations:  - Pharmacotherapy: Previously taking xanax purchased from friend, now will take prescribed Xanax temporarily with plans to work on behavioral coping strategies. - Outpatient therapy: [facility] a few years ago  Family history of psychiatric issues:  Father and brother with schizophrenia. Patient used to be concerned that if he transitioned to being female, then he would be at increased risk since a few of his female, but not female family members, have schizophrenia.  Current and history of substance use:  Periods of excessive drinking. Patient drank to cope, but says he would not drink while taking Xanax (one or the other)  Medical conditions that might explain or contribute to symptoms:    PHQ-9:  24 GAD-7:  17 MDQ (if indicated):  Criterion 1: 10, Criterion 2: yes, moderate problem  Assessment / Plan / Recommendations: Patient was outgoing,  pleasant, and expressed. Patient dealing with anxiety and panic, particularly around feeling judged and having to explain himself about his transgender identity. He was previously taking xanax purchased from a friend, but will now begin taking prescribed xanax temporarily. Patient expressed fear of having panic symptoms and avoidance, consistent with panic disorder. [facility] and learning about behavioral techniques to address anxiety and avoidance. Patient [facility] 2 weeks [facility].

## 2016-07-15 NOTE — Progress Notes (Signed)
Subjective:    Patient ID: Rachael Rogers, female    DOB: November 08, 1975, 40 y.o.   MRN: [id number]  HPI  Patient presents for rash and anxiety.   Rash Patient reports hyperpigmented rash present on lower extremities for two years. He has been seen by dermatology for this before and the rash was biopsied, however patient was told that the biopsy results were inconclusive. Patient reports that the rash is now spreading to up her his legs and onto his buttocks. Reports the rash is very itchy. He has been applying coconut oil to affected areas twice daily, which moisturizes his skin but does not improve the rash.   Anxiety/panic attacks Reports worsening anxiety with panic attacks over the past few months. Endorses about 3-4 panic attacks weekly now. Does not like to be in large crowds, but is also very weary of situations in which he might have to explain his gender identity. Avoids going to places where he was formerly known as a female out of concern that he may have to explain why he now identifies as a female. Thinks that not having formerly changed his name to a female name is also a source of anxiety, and has started the name change process. Reports symptoms of diaphoresis, palpitations, SOB, and dizziness when experiencing a panic attack. Has also been buying Xanax from a friend. Reports that 15 Xanax tablets last three months. Has been taking 1-17m Xanax each time. Reports that he always carries a Xanax tablet with him, because it calms his nerves to know that he has it if needed. Also reports crying spells and very labile mood, although says this is improving as more people accept his transgender status. Has never taken medication for his mood before, but is very interested in counseling and learning mechanisms to cope with his anxiety.   Review of Systems See HPI.     Objective:   Physical Exam  Constitutional: She is oriented to person, place, and time. No distress.  Overweight female  HENT:    Head: Normocephalic and atraumatic.  Eyes: EOM are normal.  Pulmonary/Chest: Effort normal. No respiratory distress.  Neurological: She is alert and oriented to person, place, and time.  Skin:  Hyperpigmented macules primarily on lower extremities without surround erythema. Dry flaky skin on all extremities.   Psychiatric: She has a normal mood and affect. Her behavior is normal.      Assessment & Plan:  Skin lesions Stable. Previously biopsied by dermatology with reportedly no conclusive result as to diagnosis. Recommend continuing with dermatology if symptoms worsen, however think that moisturizing with a thicker emollient than coconut oil could improve pruritis. Recommended twice daily Eucerin to affected areas.   Anxiety Worsening. Reported symptoms more consistent with panic disorder than generalized anxiety, however GAD-7 score of 17. Patient [facility] today to discuss anxiety, and has already scheduled follow up appointment with Mr. Sapuram to discuss coping mechanisms/next steps. Patient does not want to begin medication today, and would instead prefer to follow up in one month after meeting with Mr. Sapuram. Did refill Xanax #20 so that patient will not continue to buy medication from a friend. Patient reporting that he does not need #20 pills as he will not take them, however did agree that he will feel better knowing he has the medication if necessary. Mr. SLovett Calenderand myself discussed with patient that Xanax is not a long-term option, and he voiced understanding.  - F/u in  one month  Adin Hector, MD, MPH PGY-2 [facility] Pager [phone]

## 2016-07-15 NOTE — Patient Instructions (Addendum)
It was nice meeting you today Rachael Rogers!  I have changed all your prescriptions to name brand.   If you like, you can try using Eucerin or Vaseline as a moisturizer. It is important to moisturize twice a day, especially after showering. Pat your skin dry then apply the moisturizer when your skin is still a little damp. Also, take short (5-10 minute) lukewarm showers to prevent your skin from drying out further.   It is very important to keep your behavioral health appointment with Mr. Sapruam.   I would like to see you back in one month to see how you are doing.   If you have any questions or concerns, please feel free to call the clinic.   Be well,  Dr. Natale MilchLancaster

## 2016-07-18 ENCOUNTER — Telehealth: Payer: Self-pay | Admitting: Internal Medicine

## 2016-07-18 DIAGNOSIS — F419 Anxiety disorder, unspecified: Secondary | ICD-10-CM | POA: Insufficient documentation

## 2016-07-18 DIAGNOSIS — K219 Gastro-esophageal reflux disease without esophagitis: Secondary | ICD-10-CM

## 2016-07-18 DIAGNOSIS — I10 Essential (primary) hypertension: Secondary | ICD-10-CM

## 2016-07-18 NOTE — Assessment & Plan Note (Signed)
Worsening. Reported symptoms more consistent with panic disorder than generalized anxiety, however GAD-7 score of 17. Patient met with behavioral health [facility] clinic today to discuss anxiety, and has already scheduled follow up appointment with Mr. Sapuram to discuss coping mechanisms/next steps. Patient does not want to begin medication today, and would instead prefer to follow up in one month after meeting with Mr. Sapuram. Did refill Xanax #20 so that patient will not continue to buy medication from a friend. Patient reporting that he does not need #20 pills as he will not take them, however did agree that he will feel better knowing he has the medication if necessary. Mr. Lovett Calender and myself discussed with patient that Xanax is not a long-term option, and he voiced understanding.  - F/u in one month

## 2016-07-18 NOTE — Telephone Encounter (Signed)
Pt is calling because he needs the doctor to rewrite 2 prescriptions. He said that he is allergic to medications that are generic. He said his medications have to be written as Brand Name Only and dispense as written on them so that they can put a note in his file stating that he is allergic to generic medications. The Xanax has to be rewritten and he can bring this to us to trade for another one. Please call the patient to discuss. jw

## 2016-07-18 NOTE — Assessment & Plan Note (Signed)
Stable. Previously biopsied by dermatology with reportedly no conclusive result as to diagnosis. Recommend continuing with dermatology if symptoms worsen, however think that moisturizing with a thicker emollient than coconut oil could improve pruritis. Recommended twice daily Eucerin to affected areas.

## 2016-07-19 ENCOUNTER — Other Ambulatory Visit: Payer: Self-pay | Admitting: Internal Medicine

## 2016-07-19 MED ORDER — ALPRAZOLAM 1 MG PO TABS: 1.0000 mg | ORAL_TABLET | Freq: Two times a day (BID) | ORAL | 0 refills | Status: DC | PRN

## 2016-07-19 MED ORDER — LISINOPRIL-HYDROCHLOROTHIAZIDE 20-25 MG PO TABS: 1.0000 | ORAL_TABLET | Freq: Every day | ORAL | 5 refills | Status: DC

## 2016-07-19 MED ORDER — MELOXICAM 15 MG PO TABS: ORAL_TABLET | ORAL | 3 refills | Status: DC

## 2016-07-19 MED ORDER — OMEPRAZOLE 20 MG PO CPDR: 20.0000 mg | DELAYED_RELEASE_CAPSULE | Freq: Every day | ORAL | 1 refills | Status: DC

## 2016-07-19 NOTE — Telephone Encounter (Signed)
Spoke with patient and informed that rx's were ready to be picked up. Patient states that provider will still have to contact medicaid and inform them that patient is deathly allergic to generic medications in order for them to cover. Phone #[phone] Medicaid ID # [id number]

## 2016-07-22 ENCOUNTER — Ambulatory Visit (INDEPENDENT_AMBULATORY_CARE_PROVIDER_SITE_OTHER): Payer: Medicaid Other | Admitting: Internal Medicine

## 2016-07-22 ENCOUNTER — Encounter: Payer: Self-pay | Admitting: Internal Medicine

## 2016-07-22 DIAGNOSIS — Z5320 Procedure and treatment not carried out because of patient's decision for unspecified reasons: Secondary | ICD-10-CM

## 2016-07-22 NOTE — Assessment & Plan Note (Signed)
Reporting "deathly allergy" to all generic medications. Explained to patient that he is very unlikely to be allergic to all generic medications, and that the rash he is does not appear to be allergic in etiology. Also reminded patient that we discussed last week that he will have to pay the increased cost of these medications if he wants name brand, as they will not be covered by Medicaid, and that he voiced understanding at that time. Spoke with pharmacist at patient's pharmacy, who informed me that he also told the patient it is highly unlikely that he is allergic to all generic meds. Pharmacist explained to patient that he may be allergic to a medication filler, but that there are different fillers in different meds and he is unlikely to be allergic to every filler. As patient's blood pressure was 177/127 in office today, he clearly need to be taking an anti-hypertensive. As he is refusing to take a generic anti-hypertensive, I have called Medicaid to tell them that he is "unable to tolerate the medication" rather than that he has an allergy. Patient also having panic attacks for which he takes Xanax, so [facility]. Discussed that patient can take ibuprofen instead of Mobic and OTC Prilosec, and he agreed to buy these at the drugstore without a prescription.  - Called Medicaid three times this afternoon regarding Xanax and lisinopril-HCTZ with no response. Calling [phone] which is number provided by patient. Will continue to call again on Monday.  - Patient to buy ibuprofen in place of Mobic and OTC Prilosec - Explained to patient that if Medicaid will not cover Xanax and lisinopril-HCTZ if I tell them that he is unable to tolerate these generic medications, that there is nothing else I can do. Patient voiced understanding.

## 2016-07-22 NOTE — Telephone Encounter (Signed)
If patient calls regarding his prescriptions, please have called Medicaid at number provided by patient three times this afternoon with no response. Will call again on Monday.

## 2016-07-22 NOTE — Progress Notes (Signed)
   Subjective:    Patient ID: Rachael Rogers, female    DOB: 1976/06/21, 40 y.o.   MRN: [id number]  HPI  Patient presents for prescription issue.   Patient reports he is "deathly allergic" to all generic medications but cannot afford name brand medications unless Medicaid covers them. Reports allergy is a pruritic skin rash. Reports that both of his parents are also allergic to all generic meds, and that he also has many food allergies, so he is sure that he is allergic to all generic meds as well.  Saw patient last week and discussed that insurance will not cover name brand medications, and patient would have to cover this cost himself. He voiced understanding at the time and said he would pay the extra cost.  Of note, patient's rash has been present for many years. He has been evaluated by dermatology for this before, and a biopsy was performed, which was inconclusive.   Review of Systems See HPI.     Objective:   Physical Exam  Constitutional: She is oriented to person, place, and time.  Overweight female in NAD  HENT:  Head: Normocephalic and atraumatic.  Eyes: Conjunctivae and EOM are normal.  Pulmonary/Chest: Effort normal. No respiratory distress.  Neurological: She is alert and oriented to person, place, and time.  Psychiatric: She has a normal mood and affect. Her behavior is normal.      Assessment & Plan:  Patient refuses to take medication Reporting "deathly allergy" to all generic medications. Explained to patient that he is very unlikely to be allergic to all generic medications, and that the rash he is does not appear to be allergic in etiology. Also reminded patient that we discussed last week that he will have to pay the increased cost of these medications if he wants name brand, as they will not be covered by Medicaid, and that he voiced understanding at that time. Spoke with pharmacist at patient's pharmacy, who informed me that he also told the patient it is highly  unlikely that he is allergic to all generic meds. Pharmacist explained to patient that he may be allergic to a medication filler, but that there are different fillers in different meds and he is unlikely to be allergic to every filler. As patient's blood pressure was 177/127 in office today, he clearly need to be taking an anti-hypertensive. As he is refusing to take a generic anti-hypertensive, I have called Medicaid to tell them that he is "unable to tolerate the medication" rather than that he has an allergy. Patient also having panic attacks for which he takes Xanax, so [facility] as well. Discussed that patient can take ibuprofen instead of Mobic and OTC Prilosec, and he agreed to buy these at the drugstore without a prescription.  - Called Medicaid three times this afternoon regarding Xanax and lisinopril-HCTZ with no response. Calling 1 [phone] which is number provided by patient. Will continue to call again on Monday.  - Patient to buy ibuprofen in place of Mobic and OTC Prilosec - Explained to patient that if Medicaid will not cover Xanax and lisinopril-HCTZ if I tell them that he is unable to tolerate these generic medications, that there is nothing else I can do. Patient voiced understanding.   Tarri AbernethyAbigail J Baby Gieger, MD, MPH PGY-2 [facility] Pager [phone]

## 2016-07-22 NOTE — Patient Instructions (Signed)
It was nice seeing you again today Mr. Earlene PlaterDavis!  In place of Mobic, you can take two regular strength ibuprofen tablets every 6-8 hours as needed. You can also buy over the counter Prilosec 20 mg to take once a day.   I will call Medicaid to speak with them about your medications.   If you have any questions or concerns, please feel free to call the clinic.   Be well,  Dr. Natale MilchLancaster

## 2016-07-25 NOTE — Telephone Encounter (Signed)
Spoke with patient, informed him that PCP has not been able to get in touch [facility] on [facility] number provided by pharmacy. Asked if he has a number for Medicaid he would like for us to try, he did not, informed him that MD would continue to try. Patient reports having a yeast infection and wants to know if MD could call in something for that.

## 2016-07-26 NOTE — Telephone Encounter (Signed)
Called patient regarding both prescriptions and possible yeast infection.  Regarding his prescriptions, informed patient that I have been unable to get in touch with Medicaid at the number provided by the patient. I have called six times now with no response. Patient did not have any other numbers at which I could contact Medicaid. Patient says he was told before that a prescription with only the name brand of the medication would suffice, so I wrote paper prescriptions for Prinzide and Xanax for patient to pick up. (Patient to buy ibuprofen and Prilosec OTC). Patient voiced understanding and said he will pick up today.  Regarding yeast infection, patient reporting discharge. Discussed that it would be better care for patient to come to office to have wet prep. Patient already has behavioral health appointment here on Friday, and would like to come in prior to that appointment to be evaluated for possible yeast infection. Scheduled appointment for patient this Friday with Dr. Doroteo GlassmanPhelps at 11:30AM [facility] appt is at [facility] this Friday).   Tarri AbernethyAbigail J Detta Mellin, MD, MPH PGY-2 [facility] Pager [phone]

## 2016-07-29 ENCOUNTER — Encounter: Payer: Self-pay | Admitting: Obstetrics and Gynecology

## 2016-07-29 ENCOUNTER — Ambulatory Visit: Payer: Medicaid Other

## 2016-07-29 ENCOUNTER — Ambulatory Visit (INDEPENDENT_AMBULATORY_CARE_PROVIDER_SITE_OTHER): Payer: Medicaid Other | Admitting: Obstetrics and Gynecology

## 2016-07-29 VITALS — BP 160/102 | HR 71 | Temp 98.2°F | Ht 61.0 in | Wt 323.2 lb

## 2016-07-29 DIAGNOSIS — N898 Other specified noninflammatory disorders of vagina: Secondary | ICD-10-CM

## 2016-07-29 DIAGNOSIS — I10 Essential (primary) hypertension: Secondary | ICD-10-CM | POA: Diagnosis not present

## 2016-07-29 DIAGNOSIS — Z202 Contact with and (suspected) exposure to infections with a predominantly sexual mode of transmission: Secondary | ICD-10-CM

## 2016-07-29 LAB — POCT WET PREP (WET MOUNT)
CLUE CELLS WET PREP WHIFF POC: NEGATIVE
Trichomonas Wet Prep HPF POC: ABSENT

## 2016-07-29 MED ORDER — NYSTATIN-TRIAMCINOLONE 100000-0.1 UNIT/GM-% EX OINT: 1.0000 "application " | TOPICAL_OINTMENT | Freq: Two times a day (BID) | CUTANEOUS | 0 refills | Status: DC

## 2016-07-29 NOTE — Addendum Note (Signed)
Addended by: Jennette BillBUSICK, ROBERT L on: 07/29/2016 01:54 PM   Modules accepted: Orders

## 2016-07-29 NOTE — Patient Instructions (Signed)
No signs of Infection currently. Will call about results when I receive them.  Cream sent to pharmacy to use if rash present   [facility] for next [facility] at 2pm.

## 2016-07-29 NOTE — Progress Notes (Signed)
   Subjective:   Patient ID: Rachael Rogers, female    DOB: [birth year], 40 y.o.   MRN: [id number]  Patient presents for Same Day Appointment  Chief Complaint  Patient presents with  . Vaginal Itching    HPI: # VAGINAL DISCHARGE Having discharge for about a week Sexually active with wife but states they use protection Has not tried any medication Was recently given fungal cream to help with chaffing and candida intertrigo Believes he make have yeast infection Has never have pelvic exam before Never had issue with discharge Discharge consistency: think and stringy Discharge color: yellow-green  Symptoms Fever: no Dysuria:no Vaginal bleeding: no Abdomen or Pelvic pain: no Back pain: no Genital sores or ulcers: thinks has a sore on left labia Rash: no  #HTN -blood pressure elevated x2 -not taking medications because gets allergic response to generic -has script to take to pharmacy from PCP  Review of Systems   See HPI for ROS.   Smoking status - current smoker  Past medical history, surgical, family, and social history reviewed and updated in the EMR as appropriate.  Objective:  BP (!) 184/120   Pulse 71   Temp 98.2 F (36.8 C) (Oral)   Ht 5\' 1"  (1.549 m)   Wt (!) 323 lb 3.2 oz (146.6 kg)   BMI 61.07 kg/m  Vitals and nursing note reviewed  Physical Exam  Constitutional: She is well-developed, well-nourished, and in no distress.  Genitourinary: Vulva normal. Vulva exhibits no lesion and no rash. Thick  odorless  yellow-green and vaginal discharge found.  Skin: No rash noted.  Psychiatric: Mood and affect normal.   Results for orders placed or performed in visit on 07/29/16 (from the past 24 hour(s))  POCT Wet Prep [facility]([place])     Status: Abnormal   Collection Time: 07/29/16 12:09 PM  Result Value Ref Range   Source Wet Prep POC VAG    WBC, Wet Prep HPF POC TOO MANY TO COUNT    Bacteria Wet Prep HPF POC Moderate (A) None, Few, Too numerous to count   Clue  Cells Wet Prep HPF POC None None, Too numerous to count   Clue Cells Wet Prep Whiff POC Negative Whiff    Yeast Wet Prep HPF POC None    Trichomonas Wet Prep HPF POC Absent Absent    Assessment & Plan:  1. Vaginal discharge Vaginal discharge concerning on exam for some pathology. However wet prep without BV, trich, or yeast Did show numerous WBC and bacteria. Concern for STD. Will do full STD work-up. Will follow-up with patient on results. Rx sent to help with chaffing.  - POCT Wet Prep [facility]([place] [place]) - HIV antibody - RPR - Urine cytology ancillary only  2. Hypertension Follow-up BP with PCP. Take medications as prescribed.    Caryl AdaJazma Phelps, DO 07/29/2016, 11:54 AM PGY-3, [facility]

## 2016-07-30 LAB — RPR

## 2016-07-30 LAB — HIV ANTIBODY (ROUTINE TESTING W REFLEX): HIV 1&2 Ab, 4th Generation: NONREACTIVE

## 2016-08-01 ENCOUNTER — Other Ambulatory Visit (HOSPITAL_COMMUNITY)
Admission: RE | Admit: 2016-08-01 | Discharge: 2016-08-01 | Disposition: A | Discharge status: Home / Self Care | Payer: Medicaid Other | Source: Ambulatory Visit | Attending: Family Medicine | Admitting: Family Medicine

## 2016-08-01 ENCOUNTER — Other Ambulatory Visit: Payer: Medicaid Other

## 2016-08-01 ENCOUNTER — Telehealth: Payer: Self-pay | Admitting: *Deleted

## 2016-08-01 ENCOUNTER — Other Ambulatory Visit: Payer: Self-pay | Admitting: Obstetrics and Gynecology

## 2016-08-01 DIAGNOSIS — N898 Other specified noninflammatory disorders of vagina: Secondary | ICD-10-CM

## 2016-08-01 DIAGNOSIS — N760 Acute vaginitis: Secondary | ICD-10-CM | POA: Diagnosis present

## 2016-08-01 DIAGNOSIS — Z113 Encounter for screening for infections with a predominantly sexual mode of transmission: Secondary | ICD-10-CM | POA: Insufficient documentation

## 2016-08-01 NOTE — Telephone Encounter (Signed)
[facility]. Formulary and PA form placed in provider box for completion. Clovis PuMartin, Tamika L, RN

## 2016-08-02 ENCOUNTER — Telehealth: Payer: Self-pay | Admitting: Internal Medicine

## 2016-08-02 LAB — URINE CYTOLOGY ANCILLARY ONLY
CHLAMYDIA, DNA PROBE: NEGATIVE
NEISSERIA GONORRHEA: POSITIVE — AB
TRICH (WINDOWPATH): NEGATIVE

## 2016-08-02 NOTE — Telephone Encounter (Signed)
Will forward to provider that saw patient last.  Martin, Tamika L, RN  

## 2016-08-02 NOTE — Telephone Encounter (Signed)
Pt had 2 creams sent in last week, medicaid will only cover them if they are sent in separately. [facility] Please advise. Thanks! ep

## 2016-08-03 LAB — URINE CYTOLOGY ANCILLARY ONLY
Bacterial vaginitis: NEGATIVE
CANDIDA VAGINITIS: NEGATIVE

## 2016-08-03 MED ORDER — NYSTATIN 100000 UNIT/GM EX POWD: Freq: Three times a day (TID) | CUTANEOUS | 2 refills | Status: DC

## 2016-08-03 NOTE — Progress Notes (Signed)
Discussed results with patient. He will be arranging transportation to come in tomorrow for treatment in clinic. Please administer Ceftriaxone 250mg  IM and Azithro 1g orally.

## 2016-08-03 NOTE — Telephone Encounter (Signed)
Received another request for [facility] for PA nystatin-Triamcinolone. Medications can be covered by medicaid if ordered separately.  Please advise.  Clovis PuMartin, Tamika L, RN

## 2016-08-03 NOTE — Telephone Encounter (Signed)
Discontinued ordered. Rx for just nystatin powder given for yeast infection under pannus.

## 2016-08-04 ENCOUNTER — Ambulatory Visit (INDEPENDENT_AMBULATORY_CARE_PROVIDER_SITE_OTHER): Payer: Medicaid Other | Admitting: *Deleted

## 2016-08-04 DIAGNOSIS — A549 Gonococcal infection, unspecified: Secondary | ICD-10-CM | POA: Diagnosis present

## 2016-08-04 MED ORDER — CEFTRIAXONE SODIUM 1 G IJ SOLR
250.0000 mg | Freq: Once | INTRAMUSCULAR | Status: AC
  Administered 2016-08-04: 250 mg via INTRAMUSCULAR

## 2016-08-04 MED ORDER — AZITHROMYCIN 500 MG PO TABS
1000.0000 mg | ORAL_TABLET | Freq: Once | ORAL | Status: AC
  Administered 2016-08-04: 1000 mg via ORAL

## 2016-08-04 NOTE — Progress Notes (Signed)
Patient in today for STD treatment, patient given 1gram azithromycin orally and 250mg  of ceftriaxone IM, per MD order. Patient aware that partner also needs to be treated.

## 2016-08-05 ENCOUNTER — Telehealth: Payer: Self-pay | Admitting: Psychology

## 2016-08-05 ENCOUNTER — Ambulatory Visit: Payer: Medicaid Other

## 2016-08-05 NOTE — Telephone Encounter (Signed)
[facility]. Patient has been coming to clinic several times over the last week and stated that was not able to afford [employer] transportation. [facility]. [facility] tent[facility] scheduled for Monday 10/23 at 9AM.

## 2016-08-08 ENCOUNTER — Telehealth: Payer: Self-pay | Admitting: Psychology

## 2016-08-08 NOTE — Telephone Encounter (Signed)
[facility] with [facility] the phone. Patient dealing with a host of new stressors including treatment for him and his wife for gonorrhea, discussing with his wife about her past infidelity, financial difficulties, and his sister having an ulcer. Patient has been unable to fill his Xanax prescription due to financial difficulties, but finds comfort in knowing he can fill it if needed. He occasionally will manage his anxiety with a cigarette or a little bit of alcohol.   Patient has been unable to go to the gym and events he would like to go to due to anxiety as well as managing and problem-solving numerous stressors. [facility] and patient discussed the reinforcing nature of avoidance. Patient preferred to discuss facing these anxiety provoking events once his difficulties with his wife and financial situation had improved. Patient also facing insomnia for the last several years, but would like to wait on discussing insomnia as well. [facility] mainly expressed empathy for patient's difficulties and encouraged patient to cope through positive, healthy activities such as going on walks, watching TV shows the patient enjoys, and spending time with his daughter.  [facility] a call in about a week to follow-up and is interested in scheduling an in-person appointment as financial situation improves.

## 2016-08-11 ENCOUNTER — Ambulatory Visit: Payer: Medicaid Other | Admitting: Internal Medicine

## 2016-08-15 ENCOUNTER — Telehealth: Payer: Self-pay | Admitting: Psychology

## 2016-08-15 NOTE — Telephone Encounter (Signed)
[facility] Loletta Specter(Vaibhav) was unable to reach patient for scheduled phone appointment 08/15/16 at [facility]. [facility] left message advising patient to call front office to reschedule and told patient that [facility] will plan to follow up by phone in a few days if don't hear back.

## 2016-08-19 ENCOUNTER — Telehealth: Payer: Self-pay | Admitting: Psychology

## 2016-08-19 NOTE — Telephone Encounter (Signed)
[facility] to check-in about panic symptoms and life stressors. Patient said it was not a good time but asked talk at [facility]4PM. [facility] at [facility] and left the patient a message encouraging patient to call front office to schedule an appointment.

## 2016-10-13 ENCOUNTER — Ambulatory Visit (HOSPITAL_COMMUNITY)
Admission: EM | Admit: 2016-10-13 | Discharge: 2016-10-13 | Disposition: A | Discharge status: Home / Self Care | Payer: Medicaid Other | Attending: Family Medicine | Admitting: Family Medicine

## 2016-10-13 ENCOUNTER — Ambulatory Visit (INDEPENDENT_AMBULATORY_CARE_PROVIDER_SITE_OTHER): Payer: Medicaid Other

## 2016-10-13 ENCOUNTER — Encounter (HOSPITAL_COMMUNITY): Payer: Self-pay | Admitting: Family Medicine

## 2016-10-13 DIAGNOSIS — B9789 Other viral agents as the cause of diseases classified elsewhere: Secondary | ICD-10-CM

## 2016-10-13 DIAGNOSIS — J069 Acute upper respiratory infection, unspecified: Secondary | ICD-10-CM

## 2016-10-13 DIAGNOSIS — R0789 Other chest pain: Secondary | ICD-10-CM

## 2016-10-13 MED ORDER — DICLOFENAC POTASSIUM 50 MG PO TABS: 50.0000 mg | ORAL_TABLET | Freq: Three times a day (TID) | ORAL | 0 refills | Status: DC

## 2016-10-13 MED ORDER — GUAIFENESIN-CODEINE 100-10 MG/5ML PO SYRP: 10.0000 mL | ORAL_SOLUTION | Freq: Four times a day (QID) | ORAL | 0 refills | Status: DC | PRN

## 2016-10-13 MED ORDER — IPRATROPIUM BROMIDE 0.06 % NA SOLN: 2.0000 | Freq: Four times a day (QID) | NASAL | 1 refills | Status: DC

## 2016-10-13 NOTE — Discharge Instructions (Signed)
Drink plenty of fluids as discussed, use medicine as prescribed, and mucinex or delsym for cough. Return or see your doctor if further problems °

## 2016-10-13 NOTE — ED Triage Notes (Signed)
Pt here for cough x 2 weeks and rib pain radiating into back. sts hx of same with PNA.

## 2016-10-13 NOTE — ED Provider Notes (Signed)
[facility]    CSN: [id number] Arrival date & time: 10/13/16  1250     History   Chief Complaint Chief Complaint  Patient presents with  . URI    HPI Rachael Rogers is a 40 y.o. female.   The history is provided by the patient.  URI  Presenting symptoms: congestion, cough and rhinorrhea   Presenting symptoms: no fever   Severity:  Mild Onset quality:  Gradual Duration:  2 weeks Progression:  Unchanged Chronicity:  New Relieved by:  None tried Worsened by:  Nothing Ineffective treatments:  None tried Associated symptoms: no wheezing   Risk factors: sick contacts     Past Medical History:  Diagnosis Date  . Arthritis of knee    joints  . Asthma   . Family history of adverse reaction to anesthesia    Sister- laughing gas and anesthjesia does work on her.  . Head injury, acute, without loss of consciousness   . Heart murmur 1990  . Hypertension   . Sleep apnea 2011    Patient Active Problem List   Diagnosis Date Noted  . Patient refuses to take medication 07/22/2016  . Anxiety 07/18/2016  . Foot pain, left 06/01/2016  . Osteoarthritis of right knee 02/12/2016  . GERD (gastroesophageal reflux disease) 01/06/2016  . Allergic rhinosinusitis 01/06/2016  . Lower extremity edema 06/11/2015  . DOE (dyspnea on exertion) 06/11/2015  . Abnormal EKG 06/11/2015  . Bilateral lower extremity edema 05/26/2015  . Skin lesions 04/27/2015  . Arthropod bite of multiple sites of lower extremity 04/27/2015  . Sore throat 10/21/2013  . Ventral hernia 10/07/2013  . Ulnar neuropathy 05/27/2013  . Bilateral knee pain 04/24/2013  . Ganglion of left wrist 04/24/2013  . Migraine headache 02/07/2012  . Medication management 12/27/2011  . Obesity, Class III, BMI 40-49.9 (morbid obesity) (HCC) 12/27/2011  . Hypertension 10/27/2011  . Tobacco abuse 10/27/2011  . Rectal bleeding 10/27/2011  . Obstructive sleep apnea 10/27/2011  . Asthma, severe persistent,  poorly-controlled 10/27/2011  . Gender identity disorder 10/27/2011  . Irregular menses 10/27/2011    Past Surgical History:  Procedure Laterality Date  . APPENDECTOMY  1996    OB History    No data available       Home Medications    Prior to Admission medications   Medication Sig Start Date End Date Taking? Authorizing Provider  albuterol (PROVENTIL HFA;VENTOLIN HFA) 108 (90 Base) MCG/ACT inhaler Inhale 2 puffs into the lungs every 6 (six) hours as needed for wheezing. 07/15/16   Marquette Saa, MD  albuterol (PROVENTIL) (2.5 MG/3ML) 0.083% nebulizer solution INHALE VIA NEBULIZER EVERY 6 HOURS AS NEEDED FOR WHEEZING 07/15/16   Marquette Saa, MD  ALPRAZolam Prudy Feeler) 1 MG tablet Take 1 tablet (1 mg total) by mouth 2 (two) times daily as needed for anxiety. 07/19/16   Marquette Saa, MD  lisinopril-hydrochlorothiazide (PRINZIDE,ZESTORETIC) 20-25 MG tablet Take 1 tablet by mouth daily. 07/19/16   Marquette Saa, MD  loratadine (CLARITIN) 10 MG tablet Take 1 tablet (10 mg total) by mouth daily. Patient not taking: Reported on 05/16/2016 01/05/16   Smitty Cords, DO  meloxicam (MOBIC) 15 MG tablet TAKE ONE (1) TABLET BY MOUTH EVERY DAY 07/19/16   Marquette Saa, MD  nystatin (MYCOSTATIN/NYSTOP) powder Apply topically 3 (three) times daily. For yeast rash. 08/03/16   Pincus Large, DO  omeprazole (PRILOSEC) 20 MG capsule Take 1 capsule (20 mg total) by mouth daily. 30  min before first meal. 07/19/16   Marquette SaaAbigail Joseph Lancaster, MD  testosterone cypionate (DEPOTESTOTERONE CYPIONATE) 200 MG/ML injection Inject 200 mg into the muscle every 14 (fourteen) days. 08/03/14   Historical Provider, MD    Family History Family History  Problem Relation Age of Onset  . Heart failure Mother   . Hyperlipidemia Mother   . Hypertension Mother   . Asthma Father   . Hyperlipidemia Father   . Hypertension Father   . Stroke Maternal Grandmother       Social History Social History  Substance Use Topics  . Smoking status: Current Every Day Smoker    Packs/day: 0.04    Years: 25.00    Types: Cigarettes  . Smokeless tobacco: Never Used  . Alcohol use 3.0 oz/week    5 Shots of liquor per week     Comment: Hx binge drinking - has cut down     Allergies   Food and Theophyllines   Review of Systems Review of Systems  Constitutional: Negative.  Negative for fever.  HENT: Positive for congestion, postnasal drip and rhinorrhea.   Respiratory: Positive for cough. Negative for shortness of breath and wheezing.   Cardiovascular: Positive for chest pain.  All other systems reviewed and are negative.    Physical Exam Triage Vital Signs ED Triage Vitals [10/13/16 1332]  Enc Vitals Group     BP (!) 173/126     Pulse Rate 64     Resp 18     Temp 98.5 F (36.9 C)     Temp src      SpO2 99 %     Weight      Height      Head Circumference      Peak Flow      Pain Score 8     Pain Loc      Pain Edu?      Excl. in GC?    No data found.   Updated Vital Signs BP (!) 173/126   Pulse 64   Temp 98.5 F (36.9 C)   Resp 18   SpO2 99%   Visual Acuity Right Eye Distance:   Left Eye Distance:   Bilateral Distance:    Right Eye Near:   Left Eye Near:    Bilateral Near:     Physical Exam  Constitutional: She is oriented to person, place, and time. She appears well-developed and well-nourished.  HENT:  Right Ear: External ear normal.  Left Ear: External ear normal.  Nose: Nose normal.  Mouth/Throat: Oropharynx is clear and moist.  Eyes: Pupils are equal, round, and reactive to light.  Neck: Normal range of motion.  Cardiovascular: Normal rate, regular rhythm, normal heart sounds and intact distal pulses.   Pulmonary/Chest: Effort normal and breath sounds normal. She exhibits tenderness.  Abdominal: Soft. Bowel sounds are normal.  Lymphadenopathy:    She has no cervical adenopathy.  Neurological: She is alert and  oriented to person, place, and time.  Skin: Skin is warm and dry.  Nursing note and vitals reviewed.    UC Treatments / Results  Labs (all labs ordered are listed, but only abnormal results are displayed) Labs Reviewed - No data to display  EKG  EKG Interpretation None       Radiology No results found.  Procedures Procedures (including critical care time)  Medications Ordered in UC Medications - No data to display   Initial Impression / Assessment and Plan / UC Course  I have  reviewed the triage vital signs and the nursing notes.  Pertinent labs & imaging results that were available during my care of the patient were reviewed by me and considered in my medical decision making (see chart for details).  Clinical Course       Final Clinical Impressions(s) / UC Diagnoses   Final diagnoses:  None    New Prescriptions New Prescriptions   No medications on file     Linna HoffJames D Kindl, MD 10/13/16 2105

## 2016-10-21 ENCOUNTER — Emergency Department (HOSPITAL_COMMUNITY): Payer: Medicaid Other

## 2016-10-21 ENCOUNTER — Encounter (HOSPITAL_COMMUNITY): Payer: Self-pay

## 2016-10-21 ENCOUNTER — Emergency Department (HOSPITAL_COMMUNITY)
Admission: EM | Admit: 2016-10-21 | Discharge: 2016-10-21 | Disposition: A | Discharge status: Home / Self Care | Payer: Medicaid Other | Attending: Emergency Medicine | Admitting: Emergency Medicine

## 2016-10-21 DIAGNOSIS — J45909 Unspecified asthma, uncomplicated: Secondary | ICD-10-CM | POA: Diagnosis not present

## 2016-10-21 DIAGNOSIS — K439 Ventral hernia without obstruction or gangrene: Secondary | ICD-10-CM | POA: Insufficient documentation

## 2016-10-21 DIAGNOSIS — F17210 Nicotine dependence, cigarettes, uncomplicated: Secondary | ICD-10-CM | POA: Diagnosis not present

## 2016-10-21 DIAGNOSIS — I10 Essential (primary) hypertension: Secondary | ICD-10-CM | POA: Insufficient documentation

## 2016-10-21 DIAGNOSIS — Z79899 Other long term (current) drug therapy: Secondary | ICD-10-CM | POA: Diagnosis not present

## 2016-10-21 DIAGNOSIS — R109 Unspecified abdominal pain: Secondary | ICD-10-CM

## 2016-10-21 DIAGNOSIS — R1084 Generalized abdominal pain: Secondary | ICD-10-CM | POA: Diagnosis present

## 2016-10-21 LAB — CBC WITH DIFFERENTIAL/PLATELET
BASOS PCT: 0 %
Basophils Absolute: 0 10*3/uL (ref 0.0–0.1)
Eosinophils Absolute: 0.2 10*3/uL (ref 0.0–0.7)
Eosinophils Relative: 2 %
HEMATOCRIT: 48.4 % — AB (ref 36.0–46.0)
Hemoglobin: 16.4 g/dL — ABNORMAL HIGH (ref 12.0–15.0)
Lymphocytes Relative: 37 %
Lymphs Abs: 3.7 10*3/uL (ref 0.7–4.0)
MCH: 30.2 pg (ref 26.0–34.0)
MCHC: 33.9 g/dL (ref 30.0–36.0)
MCV: 89.1 fL (ref 78.0–100.0)
MONO ABS: 0.8 10*3/uL (ref 0.1–1.0)
MONOS PCT: 8 %
NEUTROS ABS: 5.3 10*3/uL (ref 1.7–7.7)
Neutrophils Relative %: 53 %
Platelets: 291 10*3/uL (ref 150–400)
RBC: 5.43 MIL/uL — ABNORMAL HIGH (ref 3.87–5.11)
RDW: 14 % (ref 11.5–15.5)
WBC: 9.9 10*3/uL (ref 4.0–10.5)

## 2016-10-21 LAB — COMPREHENSIVE METABOLIC PANEL
ALT: 16 U/L (ref 14–54)
ANION GAP: 7 (ref 5–15)
AST: 15 U/L (ref 15–41)
Albumin: 3.8 g/dL (ref 3.5–5.0)
Alkaline Phosphatase: 54 U/L (ref 38–126)
BUN: 15 mg/dL (ref 6–20)
CO2: 28 mmol/L (ref 22–32)
Calcium: 9 mg/dL (ref 8.9–10.3)
Chloride: 104 mmol/L (ref 101–111)
Creatinine, Ser: 0.95 mg/dL (ref 0.44–1.00)
Glucose, Bld: 89 mg/dL (ref 65–99)
POTASSIUM: 4.2 mmol/L (ref 3.5–5.1)
Sodium: 139 mmol/L (ref 135–145)
TOTAL PROTEIN: 7.7 g/dL (ref 6.5–8.1)
Total Bilirubin: 0.5 mg/dL (ref 0.3–1.2)

## 2016-10-21 LAB — LIPASE, BLOOD: LIPASE: 23 U/L (ref 11–51)

## 2016-10-21 LAB — POC URINE PREG, ED: Preg Test, Ur: NEGATIVE

## 2016-10-21 MED ORDER — MORPHINE SULFATE (PF) 4 MG/ML IV SOLN
4.0000 mg | Freq: Once | INTRAVENOUS | Status: AC
  Administered 2016-10-21: 4 mg via INTRAVENOUS
  Filled 2016-10-21: qty 1

## 2016-10-21 MED ORDER — ONDANSETRON HCL 4 MG/2ML IJ SOLN
4.0000 mg | Freq: Once | INTRAMUSCULAR | Status: AC
  Administered 2016-10-21: 4 mg via INTRAVENOUS
  Filled 2016-10-21: qty 2

## 2016-10-21 MED ORDER — OXYCODONE-ACETAMINOPHEN 5-325 MG PO TABS: 1.0000 | ORAL_TABLET | ORAL | 0 refills | Status: DC | PRN

## 2016-10-21 MED ORDER — ONDANSETRON HCL 4 MG PO TABS: 4.0000 mg | ORAL_TABLET | Freq: Four times a day (QID) | ORAL | 0 refills | Status: DC

## 2016-10-21 MED ORDER — SODIUM CHLORIDE 0.9 % IV BOLUS (SEPSIS)
1000.0000 mL | Freq: Once | INTRAVENOUS | Status: AC
Start: 2016-10-21 — End: 2016-10-21
  Administered 2016-10-21: 1000 mL via INTRAVENOUS

## 2016-10-21 NOTE — ED Notes (Signed)
Bed: WA05 Expected date:  Expected time:  Means of arrival:  Comments: EMS 

## 2016-10-21 NOTE — ED Notes (Signed)
Removed PIV #20 form left hand.

## 2016-10-21 NOTE — ED Triage Notes (Signed)
Per [facility]- Pt is a female however pt will be addressed as FEMALE. Stomach pain Upper QUADS x 1 week. HX of Hernia. Seen at Urgent care earlier this week. Denies CP and SOB. No EKG performed..Marland Kitchen

## 2016-10-21 NOTE — Discharge Instructions (Signed)
Test showed no life-threatening condition. Strongly recommend follow-up at family practice center. Medications for pain and nausea. Return forexcessive vomiting, worsening pain, any concerns

## 2016-10-22 NOTE — ED Provider Notes (Signed)
[facility]   CSN: [id number] Arrival date & time: 10/21/16  1817     History   Chief Complaint Chief Complaint  Patient presents with  . Abdominal Pain  . Nausea  . Diarrhea    HPI Rachael Rogers is a 41 y.o. female.  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  Patient is morbidly obese and has a long-standing ventral hernia. He is eating without vomiting. No fever, sweats, chills, diarrhea.      Past Medical History:  Diagnosis Date  . Arthritis of knee    joints  . Asthma   . Family history of adverse reaction to anesthesia    Sister- laughing gas and anesthjesia does work on her.  . Head injury, acute, without loss of consciousness   . Heart murmur 1990  . Hypertension   . Sleep apnea 2011    Patient Active Problem List   Diagnosis Date Noted  . Patient refuses to take medication 07/22/2016  . Anxiety 07/18/2016  . Foot pain, left 06/01/2016  . Osteoarthritis of right knee 02/12/2016  . GERD (gastroesophageal reflux disease) 01/06/2016  . Allergic rhinosinusitis 01/06/2016  . Lower extremity edema 06/11/2015  . DOE (dyspnea on exertion) 06/11/2015  . Abnormal EKG 06/11/2015  . Bilateral lower extremity edema 05/26/2015  . Skin lesions 04/27/2015  . Arthropod bite of multiple sites of lower extremity 04/27/2015  . Sore throat 10/21/2013  . Ventral hernia 10/07/2013  . Ulnar neuropathy 05/27/2013  . Bilateral knee pain 04/24/2013  . Ganglion of left wrist 04/24/2013  . Migraine headache 02/07/2012  . Medication management 12/27/2011  . Obesity, Class III, BMI 40-49.9 (morbid obesity) (HCC) 12/27/2011  . Hypertension 10/27/2011  . Tobacco abuse 10/27/2011  . Rectal bleeding 10/27/2011  . Obstructive sleep apnea 10/27/2011  . Asthma, severe persistent, poorly-controlled 10/27/2011  . Gender identity disorder 10/27/2011  . Irregular menses  10/27/2011    Past Surgical History:  Procedure Laterality Date  . APPENDECTOMY  1996    OB History    No data available       Home Medications    Prior to Admission medications   Medication Sig Start Date End Date Taking? Authorizing Provider  albuterol (PROVENTIL HFA;VENTOLIN HFA) 108 (90 Base) MCG/ACT inhaler Inhale 2 puffs into the lungs every 6 (six) hours as needed for wheezing or shortness of breath.   Yes Historical Provider, MD  albuterol (PROVENTIL) (2.5 MG/3ML) 0.083% nebulizer solution Take 2.5 mg by nebulization every 6 (six) hours as needed for wheezing or shortness of breath.   Yes Historical Provider, MD  ALPRAZolam Prudy Feeler(XANAX) 1 MG tablet Take 1 tablet (1 mg total) by mouth 2 (two) times daily as needed for anxiety. 07/19/16  Yes Marquette SaaAbigail Joseph Lancaster, MD  lisinopril-hydrochlorothiazide (PRINZIDE,ZESTORETIC) 20-25 MG tablet Take 1 tablet by mouth at bedtime.   Yes Historical Provider, MD  testosterone cypionate (DEPOTESTOTERONE CYPIONATE) 200 MG/ML injection Inject 80 mg into the muscle once a week.    Yes Historical Provider, MD  ondansetron (ZOFRAN) 4 MG tablet Take 1 tablet (4 mg total) by mouth every 6 (six) hours. 10/21/16   Donnetta HutchingBrian Arelyn Gauer, MD  oxyCODONE-acetaminophen (PERCOCET) 5-325 MG tablet Take 1-2 tablets by mouth every 4 (four) hours as needed. 10/21/16   Donnetta HutchingBrian Ayde Record, MD    Family History Family History  Problem Relation Age of Onset  . Heart failure Mother   . Hyperlipidemia Mother   .  Hypertension Mother   . Asthma Father   . Hyperlipidemia Father   . Hypertension Father   . Stroke Maternal Grandmother     Social History Social History  Substance Use Topics  . Smoking status: Current Every Day Smoker    Packs/day: 0.04    Years: 25.00    Types: Cigarettes  . Smokeless tobacco: Never Used  . Alcohol use 3.0 oz/week    5 Shots of liquor per week     Comment: Hx binge drinking - has cut down     Allergies   Food; Milk-related compounds; Shellfish  allergy; and Theophyllines   Review of Systems Review of Systems  All other systems reviewed and are negative.    Physical Exam Updated Vital Signs BP 157/93 (BP Location: Left Arm)   Pulse 84   Temp 98.2 F (36.8 C) (Oral)   Resp 15   Ht 5' (1.524 m)   Wt 300 lb (136.1 kg)   SpO2 96%   BMI 58.59 kg/m   Physical Exam  Constitutional: She is oriented to person, place, and time.  Morbidly obese  HENT:  Head: Normocephalic and atraumatic.  Eyes: Conjunctivae are normal.  Neck: Neck supple.  Cardiovascular: Normal rate and regular rhythm.   Pulmonary/Chest: Effort normal and breath sounds normal.  Abdominal:  3 cm ventral hernia in the supraumbilical area  Musculoskeletal: Normal range of motion.  Neurological: She is alert and oriented to person, place, and time.  Skin: Skin is warm and dry.  Psychiatric: She has a normal mood and affect. Her behavior is normal.  Nursing note and vitals reviewed.    ED Treatments / Results  Labs (all labs ordered are listed, but only abnormal results are displayed) Labs Reviewed  CBC WITH DIFFERENTIAL/PLATELET - Abnormal; Notable for the following:       Result Value   RBC 5.43 (*)    Hemoglobin 16.4 (*)    HCT 48.4 (*)    All other components within normal limits  COMPREHENSIVE METABOLIC PANEL  LIPASE, BLOOD  POC URINE PREG, ED    EKG  EKG Interpretation None       Radiology Dg Abdomen Acute W/chest  Result Date: 10/21/2016 CLINICAL DATA:  Supraumbilical hernia.  Rule out bowel obstruction. EXAM: DG ABDOMEN ACUTE W/ 1V CHEST COMPARISON:  CT abdomen and pelvis from 05/28/2014, CXR 10/13/2016 FINDINGS: Top normal size cardiac silhouette. No aortic aneurysm. Mild vascular congestion without pneumonic consolidation, effusion or pneumothorax. Small segment of mildly dilated small bowel noted in the left hemiabdomen. Otherwise, the visualized small and large bowel loops are unremarkable. No free air. No suspicious calculi nor  organomegaly. IMPRESSION: Mild vascular congestion.  No active disease in chest. Mildly dilated short segment of small intestine in the left hemiabdomen. Other small bowel loops as well as large intestine are unremarkable. Findings could represent a mild localized ileus. This does not conform to the site of supraumbilical hernia seen on prior CT. Electronically Signed   By: Tollie Eth M.D.   On: 10/21/2016 20:42    Procedures Procedures (including critical care time)  Medications Ordered in ED Medications  sodium chloride 0.9 % bolus 1,000 mL (0 mLs Intravenous Stopped 10/21/16 2217)  ondansetron (ZOFRAN) injection 4 mg (4 mg Intravenous Given 10/21/16 2103)  morphine 4 MG/ML injection 4 mg (4 mg Intravenous Given 10/21/16 2134)     Initial Impression / Assessment and Plan / ED Course  I have reviewed the triage vital signs and the nursing  notes.  Pertinent labs & imaging results that were available during my care of the patient were reviewed by me and considered in my medical decision making (see chart for details).  Clinical Course     Patient has long-standing ventral hernia. This does not appear to have changed. He is able to eat and keep fluids down. White count normal. He was given IV fluids and pain management. Discharge medications Percocet and Zofran 4 mg. He has primary care follow-up at the family practice center.  Final Clinical Impressions(s) / ED Diagnoses   Final diagnoses:  Abdominal pain, unspecified abdominal location  Ventral hernia without obstruction or gangrene    New Prescriptions Discharge Medication List as of 10/21/2016  9:45 PM    START taking these medications   Details  ondansetron (ZOFRAN) 4 MG tablet Take 1 tablet (4 mg total) by mouth every 6 (six) hours., Starting Fri 10/21/2016, Print    oxyCODONE-acetaminophen (PERCOCET) 5-325 MG tablet Take 1-2 tablets by mouth every 4 (four) hours as needed., Starting Fri 10/21/2016, Print         Donnetta Hutching,  MD 10/22/16 [phone]

## 2016-12-12 ENCOUNTER — Ambulatory Visit (INDEPENDENT_AMBULATORY_CARE_PROVIDER_SITE_OTHER): Payer: Medicaid Other | Admitting: Internal Medicine

## 2016-12-12 ENCOUNTER — Ambulatory Visit (HOSPITAL_COMMUNITY)
Admission: RE | Admit: 2016-12-12 | Discharge: 2016-12-12 | Disposition: A | Discharge status: Home / Self Care | Payer: Medicaid Other | Source: Ambulatory Visit | Attending: Family Medicine | Admitting: Family Medicine

## 2016-12-12 ENCOUNTER — Encounter: Payer: Self-pay | Admitting: Internal Medicine

## 2016-12-12 ENCOUNTER — Other Ambulatory Visit: Payer: Self-pay

## 2016-12-12 ENCOUNTER — Encounter: Payer: Self-pay | Admitting: Psychology

## 2016-12-12 VITALS — BP 142/80 | HR 84 | Temp 98.9°F | Wt 323.0 lb

## 2016-12-12 DIAGNOSIS — R0789 Other chest pain: Secondary | ICD-10-CM

## 2016-12-12 DIAGNOSIS — Z23 Encounter for immunization: Secondary | ICD-10-CM

## 2016-12-12 DIAGNOSIS — I4581 Long QT syndrome: Secondary | ICD-10-CM | POA: Diagnosis not present

## 2016-12-12 DIAGNOSIS — R9431 Abnormal electrocardiogram [ECG] [EKG]: Secondary | ICD-10-CM | POA: Insufficient documentation

## 2016-12-12 LAB — TROPONIN I: Troponin I: <0.03 ng/mL (ref <0.03)

## 2016-12-12 LAB — D-DIMER, QUANTITATIVE: D-Dimer, Quant: 0.3 ug/mL-FEU (ref 0.00–0.50)

## 2016-12-12 MED ORDER — ALPRAZOLAM 1 MG PO TABS: 1.0000 mg | ORAL_TABLET | Freq: Two times a day (BID) | ORAL | 0 refills | Status: AC | PRN

## 2016-12-12 MED ORDER — IBUPROFEN 800 MG PO TABS: 800.0000 mg | ORAL_TABLET | Freq: Three times a day (TID) | ORAL | 0 refills | Status: DC | PRN

## 2016-12-12 MED ORDER — ACETAMINOPHEN 325 MG PO TABS: 650.0000 mg | ORAL_TABLET | Freq: Four times a day (QID) | ORAL | 0 refills | Status: AC | PRN

## 2016-12-12 NOTE — Progress Notes (Unsigned)
Assessment / Plan / Recommendations: [facility] spoke with Dr. Brett Albino about seeing patient about panic and anxiety during his scheduled same-day appointment. Patient met with [facility] very briefly as patient was here with wife and preferred to schedule a separate meeting. Patient was also waiting for more information about EKG abnormality. Patient will call in next week to schedule appointment but if unable, [facility] will check in by phone in a week.  Warmhandoff:  Yes

## 2016-12-12 NOTE — Patient Instructions (Addendum)
It was so nice to meet you!  I have ordered a few tests today. Your EKG showed some signs that your heart was being damaged. I have ordered a troponin to evaluate this further. If this level is elevated, I will call you to tell you to go to the Emergency Department. If this level is normal, I will send you to the Cardiologist to have further testing done of your heart. I have ordered a chest x-ray to make sure your lungs look okay. [facility]. I have also ordered a d-dimer, which will tell us if you possibly have a blood clot in your lungs. I will call you with the results of your chest x-ray and d-dimer.    -Dr. Nancy MarusMayo

## 2016-12-12 NOTE — Progress Notes (Signed)
   [facility] Phone: [phone]  Subjective:  Rachael Rogers is a 41 year old FTM presenting to same day clinic with chest pain for the last 3-4 weeks. The chest pain is located on the left side her chest. The chest pain is worse with coughing and moving her left arm. The chest pain is not worse with walking or exertion. She has been using Motrin 800mg , which have been helping. The chest pain is "sharp" in nature. The chest pain radiates from the left side of her chest to her left scapula. She has shortness of breath at baseline due to her asthma, and she states her breathing has been normal recently. No nausea, no diaphoresis. She has been coughing a little more over the last month, but states she typically has bronchitis this time of year. Cough is not productive. No fevers, no chills. No recent surgeries, no long car/plane rides, no recently immobilization, no lower extremity edema.  ROS: See HPI for pertinent positives and negatives  Past Medical History- HTN, OSA, severe asthma, GERD, tobacco use, severe obesity  Family history- mother with cardiac stents placed starting at age 43. Father with heart disease starting at age 43.  Social history- patient is a current smoker.  Objective: BP (!) 142/80   Pulse 84   Temp 98.9 F (37.2 C) (Oral)   Wt (!) 323 lb (146.5 kg)   SpO2 98%   BMI 63.08 kg/m  Gen: NAD, alert, cooperative with exam HEENT: NCAT, EOMI, MMM Neck: FROM, supple CV: RRR, no murmur, chest pain reproducible on palpation of the sternum Resp: Mildly decreased work of breathing, but lungs clear. GI: SNTND, BS present, no guarding or organomegaly Msk: No edema, warm, normal tone, moves UE/LE spontaneously Neuro: Alert and oriented, no gross deficits Skin: No rashes present on chest.  Assessment/Plan: Chest Pain: Pt presenting with left sided chest pain. She has many atypical features including chest pain worse with cough and chest pain that is  reproducible on palpation of the sternum. No associated SOB, nausea, or diaphoresis. No pain with exertion.  - EKG performed in clinic with new ST and T wave changes in the inferior leads compared to previous EKG on 05/2015. - Stat troponin and d-dimer negative - CXR negative for acute abnormalities - Will refer to Cardiology for further testing. Pt may benefit from stress test.   Willadean CarolKaty Mayo, MD PGY-2

## 2016-12-13 NOTE — Progress Notes (Signed)
Cardiology Office Note    Date:  12/14/2016   ID:  Rachael Rogers, DOB [birth year], MRN [id number]  PCP:  Rachael Abernethy, MD  Cardiologist:  Dr. Delton Rogers  CC: chest pain   History of Present Illness:  Rachael Rogers is a 41 y.o. female transitioning to a female on testosterone therapy with a history of morbid obesity, HTN, tobacco abuse family history of premature CAD, OSA non complaint with CPAP (cat damaged mask) and severe asthma who presents to clinic for evaluation of chest pain. She goes by "Rachael Rogers."   She was seen by Dr. Delton Rogers in 07/2015 for pre op evaluation prior to a ventral hernia repair. She reported dyspnea on exertion that was chronic. 2D ECHO showed normal LVEF, trivial MR and mild LAE. She was cleared for surgery.   She was seen in the office by Dr. Nancy Rogers (PCP) on 12/12/16 for evaluation of chest pain. D Dimer and troponin were normal. ECG showed TWI in inferolateral leads and she was referred to cardiology for further evaluation.  Today she presents to clinic for evaluation of chest pain. She has been having palpitations and sharp left sided chest pain. It started a couple weeks ago and she has had constant pain. Motrin did help but PCP told her not to take so much and recommended tylenol. She has chronic asthma to the point that she is on disability for this. She also has palpitations like her heart is pounding or fluttering. She notices this mostly at night. These episodes last a few minutes. She also noticed it when she got to the top of the stairs the other day. No associated CP or SOB. She feels dizzy once in a while and when she gets palpitations but no syncope. No LE edema, she has chronically not been able to sleep on her back (since she was a kid) no PND. She does not formally exercise but has a very long driveway and does get dyspnea on exertion. No chest pain with exertion. She recently stopped taking the lisinopril HCT because she thinks it caused a rash. She also was  intolerant to amlodipine because it caused hives. Both her parents are allergic to all generic medications.    Her mother and father both have CAD- both had a MI in her early 50s.    Past Medical History:  Diagnosis Date  . Arthritis of knee    joints  . Asthma   . Family history of adverse reaction to anesthesia    Sister- laughing gas and anesthjesia does work on her.  . Head injury, acute, without loss of consciousness   . Heart murmur 1990  . Hypertension   . Sleep apnea 2011    Past Surgical History:  Procedure Laterality Date  . APPENDECTOMY  1996    Current Medications: Outpatient Medications Prior to Visit  Medication Sig Dispense Refill  . acetaminophen (TYLENOL) 325 MG tablet Take 2 tablets (650 mg total) by mouth every 6 (six) hours as needed. 60 tablet 0  . albuterol (PROVENTIL HFA;VENTOLIN HFA) 108 (90 Base) MCG/ACT inhaler Inhale 2 puffs into the lungs every 4 (four) hours as needed for wheezing or shortness of breath.     Marland Kitchen albuterol (PROVENTIL) (2.5 MG/3ML) 0.083% nebulizer solution Take 2.5 mg by nebulization every 6 (six) hours as needed for wheezing or shortness of breath.    . ALPRAZolam (XANAX) 1 MG tablet Take 1 tablet (1 mg total) by mouth 2 (two) times daily as needed for  anxiety. 20 tablet 0  . ibuprofen (ADVIL,MOTRIN) 800 MG tablet Take 1 tablet (800 mg total) by mouth every 8 (eight) hours as needed. 30 tablet 0  . testosterone cypionate (DEPOTESTOTERONE CYPIONATE) 200 MG/ML injection Inject 80 mg into the muscle once a week.   0  . lisinopril-hydrochlorothiazide (PRINZIDE,ZESTORETIC) 20-25 MG tablet Take 1 tablet by mouth at bedtime.    . ondansetron (ZOFRAN) 4 MG tablet Take 1 tablet (4 mg total) by mouth every 6 (six) hours. (Patient not taking: Reported on 12/14/2016) 10 tablet 0  . oxyCODONE-acetaminophen (PERCOCET) 5-325 MG tablet Take 1-2 tablets by mouth every 4 (four) hours as needed. (Patient not taking: Reported on 12/14/2016) 15 tablet 0   No  facility-administered medications prior to visit.      Allergies:   Food; Milk-related compounds; Shellfish allergy; and Theophyllines   Social History   Social History  . Marital status: Married    Spouse name: N/A  . Number of children: N/A  . Years of education: N/A   Social History Main Topics  . Smoking status: Current Every Day Smoker    Packs/day: 0.04    Years: 25.00    Types: Cigarettes  . Smokeless tobacco: Never Used  . Alcohol use 3.0 oz/week    5 Shots of liquor per week     Comment: Hx binge drinking - has cut down  . Drug use: No     Comment: quit in 2008  . Sexual activity: Yes    Partners: Female   Other Topics Concern  . None   Social History Narrative  . None     Family History:  The patient's family history includes Asthma in her father; Heart failure in her mother; Hyperlipidemia in her father and mother; Hypertension in her father and mother; Stroke in her maternal grandmother.      VS:  BP (!) 160/62   Pulse 78   Ht 5' (1.524 m)   Wt (!) 321 lb (145.6 kg)   BMI 62.69 kg/m    GEN: Well nourished, well developed, in no acute distress, morbidly obese HEENT: normal  Neck: no JVD, carotid bruits, or masses Cardiac: RRR; no murmurs, rubs, or gallops,no edema  Respiratory:  clear to auscultation bilaterally, normal work of breathing GI: soft, nontender, nondistended, + BS MS: no deformity or atrophy  Skin: warm and dry, no rash Neuro:  Alert and Oriented x 3, Strength and sensation are intact Psych: euthymic mood, full affect     Wt Readings from Last 3 Encounters:  12/14/16 (!) 321 lb (145.6 kg)  12/12/16 (!) 323 lb (146.5 kg)  10/21/16 300 lb (136.1 kg)      Studies/Labs Reviewed:   EKG:  EKG is ordered today.  The ekg ordered today demonstrates NSR HR 78 with LVH and TWI in lead I and TW flattening in V6.  Recent Labs: 10/21/2016: ALT 16; BUN 15; Creatinine, Ser 0.95; Hemoglobin 16.4; Platelets 291; Potassium 4.2; Sodium 139    Lipid Panel    Component Value Date/Time   CHOL 182 04/24/2013 1159   TRIG 93 04/24/2013 1159   HDL 38 (L) 04/24/2013 1159   CHOLHDL 4.8 04/24/2013 1159   VLDL 19 04/24/2013 1159   LDLCALC 125 (H) 04/24/2013 1159    Additional studies/ records that were reviewed today include:  TTE: 06/24/2015 - Left ventricle: The cavity size was normal. There was mild concentric hypertrophy. Systolic function was vigorous. The estimated ejection fraction was in the range of 65%  to 70%. Wall motion was normal; there were no regional wall motion abnormalities. There was no evidence of elevated ventricular filling pressure by Doppler parameters. - Mitral valve: There was trivial regurgitation. - Left atrium: The atrium was mildly dilated.    ASSESSMENT & PLAN:   Chest pain: atypical for cardiac chest pain but with her many RFs for CAD (obesity, tobacco abuse, HTN, horemore therapy and family history) I will set up a 2 day lexiscan myoview.  Morbid obesity: Body mass index is 62.69 kg/m. diet and weight loss recommended.  HTN: she is intolerant to lisinopril HCT and amlodipine (rash). She didn't like lasix because she had to urinate so much. She is convinced it is the "generic" medications that she is intolerant to. Her BP is too high and she needs to be on something. Will start chlorthalidone 25mg  daily and get a BMET in 2 weeks.   OSA: non complaint with CPAP because cat damaged mask.   Tobacco abuse: counseled on the importance of cessation.   Palpitations: will place a 30 day event monitor. She is certainly at risk for afib given her morbid obesity and uncontrolled sleep apnea   Medication Adjustments/Labs and Tests Ordered: Current medicines are reviewed at length with the patient today.  Concerns regarding medicines are outlined above.  Medication changes, Labs and Tests ordered today are listed in the Patient Instructions below. Patient Instructions  Medication  Instructions:  Your physician has recommended you make the following change in your medication:  1.  START the Chlorthalidone 25 mg taking 1 tablet daily   Labwork: None ordered  Testing/Procedures: Your physician has requested that you have a lexiscan myoview. For further information please visit [email or web address]. Please follow instruction sheet, as given.   Your physician has recommended that you wear an event monitor. Event monitors are medical devices that record the heart's electrical activity. Doctors most often us these monitors to diagnose arrhythmias. Arrhythmias are problems with the speed or rhythm of the heartbeat. The monitor is a small, portable device. You can wear one while you do your normal daily activities. This is usually used to diagnose what is causing palpitations/syncope (passing out).   Follow-Up: Your physician recommends that you schedule a follow-up appointment in: DEPENDING UPON TEST RESULTS  Any Other Special Instructions Will Be Listed Below (If Applicable). Pharmacologic Stress Electrocardiogram Introduction A pharmacologic stress electrocardiogram is a heart (cardiac) test that uses nuclear imaging to evaluate the blood supply to your heart. This test may also be called a pharmacologic stress electrocardiography. Pharmacologic means that a medicine is used to increase your heart rate and blood pressure. This stress test is done to find areas of poor blood flow to the heart by determining the extent of coronary artery disease (CAD). Some people exercise on a treadmill, which naturally increases the blood flow to the heart. For those people unable to exercise on a treadmill, a medicine is used. This medicine stimulates your heart and will cause your heart to beat harder and more quickly, as if you were exercising. Pharmacologic stress tests can help determine:  The adequacy of blood flow to your heart during increased levels of activity in order to clear you  for discharge home.  The extent of coronary artery blockage caused by CAD.  Your prognosis if you have suffered a heart attack.  The effectiveness of cardiac procedures done, such as an angioplasty, which can increase the circulation in your coronary arteries.  Causes of chest pain or pressure. LET YOUR  HEALTH CARE PROVIDER KNOW ABOUT:  Any allergies you have.  All medicines you are taking, including vitamins, herbs, eye drops, creams, and over-the-counter medicines.  Previous problems you or members of your family have had with the use of anesthetics.  Any blood disorders you have.  Previous surgeries you have had.  Medical conditions you have.  Possibility of pregnancy, if this applies.  If you are currently breastfeeding. RISKS AND COMPLICATIONS Generally, this is a safe procedure. However, as with any procedure, complications can occur. Possible complications include:  You develop pain or pressure in the following areas:  Chest.  Jaw or neck.  Between your shoulder blades.  Radiating down your left arm.  Headache.  Dizziness or light-headedness.  Shortness of breath.  Increased or irregular heartbeat.  Low blood pressure.  Nausea or vomiting.  Flushing.  Redness going up the arm and slight pain during injection of medicine.  Heart attack (rare). BEFORE THE PROCEDURE  Avoid all forms of caffeine for 24 hours before your test or as directed by your health care provider. This includes coffee, tea (even decaffeinated tea), caffeinated sodas, chocolate, cocoa, and certain pain medicines.  Follow your health care provider's instructions regarding eating and drinking before the test.  Take your medicines as directed at regular times with water unless instructed otherwise. Exceptions may include:  If you have diabetes, ask how you are to take your insulin or pills. It is common to adjust insulin dosing the morning of the test.  If you are taking  beta-blocker medicines, it is important to talk to your health care provider about these medicines well before the date of your test. Taking beta-blocker medicines may interfere with the test. In some cases, these medicines need to be changed or stopped 24 hours or more before the test.  If you wear a nitroglycerin patch, it may need to be removed prior to the test. Ask your health care provider if the patch should be removed before the test.  If you use an inhaler for any breathing condition, bring it with you to the test.  If you are an outpatient, bring a snack so you can eat right after the stress phase of the test.  Do not smoke for 4 hours prior to the test or as directed by your health care provider.  Do not apply lotions, powders, creams, or oils on your chest prior to the test.  Wear comfortable shoes and clothing. Let your health care provider know if you were unable to complete or follow the preparations for your test. PROCEDURE  Multiple patches (electrodes) will be put on your chest. If needed, small areas of your chest may be shaved to get better contact with the electrodes. Once the electrodes are attached to your body, multiple wires will be attached to the electrodes, and your heart rate will be monitored.  An IV access will be started. A nuclear trace (isotope) is given. The isotope may be given intravenously, or it may be swallowed. Nuclear refers to several types of radioactive isotopes, and the nuclear isotope lights up the arteries so that the nuclear images are clear. The isotope is absorbed by your body. This results in low radiation exposure.  A resting nuclear image is taken to show how your heart functions at rest.  A medicine is given through the IV access.  A second scan is done about 1 hour after the medicine injection and determines how your heart functions under stress.  During this stress phase,  you will be connected to an electrocardiogram machine. Your  blood pressure and oxygen levels will be monitored. What to expect after the procedure  Your heart rate and blood pressure will be monitored after the test.  You may return to your normal schedule, including diet,activities, and medicines, unless your health care provider tells you otherwise. This information is not intended to replace advice given to you by your health care provider. Make sure you discuss any questions you have with your health care provider. Document Released: 02/19/2009 Document Revised: 03/10/2016 Document Reviewed: 04/11/2016 Elsevier Interactive Patient Education  2017 [employer]    Cardiac Event Monitoring A cardiac event monitor is a small recording device that is used to detect abnormal heart rhythms (arrhythmias). The monitor is used to record your heart rhythm when you have symptoms, such as:  Fast heartbeats (palpitations), such as heart racing or fluttering.  Dizziness.  Fainting or light-headedness.  Unexplained weakness. Some monitors are wired to electrodes placed on your chest. Electrodes are flat, sticky disks that attach to your skin. Other monitors may be hand-held or worn on the wrist. The monitor can be worn for up to 30 days. If the monitor is attached to your chest, a technician will prepare your chest for the electrode placement and show you how to work the monitor. Take time to practice using the monitor before you leave the office. Make sure you understand how to send the information from the monitor to your health care provider. In some cases, you may need to use a landline telephone instead of a cell phone. What are the risks? Generally, this device is safe to use, but it possible that the skin under the electrodes will become irritated. How to use your cardiac event monitor  Wear your monitor at all times, except when you are in water:  Do not let the monitor get wet.  Take the monitor off when you bathe. Do not swim or use a hot tub  with it on.  Keep your skin clean. Do not put body lotion or moisturizer on your chest.  Change the electrodes as told by your health care provider or any time they stop sticking to your skin. You may need to use medical tape to keep them on.  Try to put the electrodes in slightly different places on your chest to help prevent skin irritation. They must remain in the area under your left breast and in the upper right section of your chest.  Make sure the monitor is safely clipped to your clothing or in a location close to your body that your health care provider recommends.  Press the button to record as soon as you feel heart-related symptoms, such as:  Dizziness.  Weakness.  Light-headedness.  Palpitations.  Thumping or pounding in your chest.  Shortness of breath.  Unexplained weakness.  Keep a diary of your activities, such as walking, doing chores, and taking medicine. It is very important to note what you were doing when you pushed the button to record your symptoms. This will help your health care provider determine what might be contributing to your symptoms.  Send the recorded information as recommended by your health care provider. It may take some time for your health care provider to process the results.  Change the batteries as told by your health care provider.  Keep electronic devices away from your monitor. This includes:  Tablets.  MP3 players.  Cell phones.  While wearing your monitor you should avoid:  Electric blankets.  Firefighter.  Electric toothbrushes.  Microwave ovens.  Magnets.  Metal detectors. Get help right away if:  You have chest pain.  You have extreme difficulty breathing or shortness of breath.  You develop a very fast heartbeat that persists.  You develop dizziness that does not go away.  You faint or constantly feel like you are about to faint. Summary  A cardiac event monitor is a small recording device that  is used to help detect abnormal heart rhythms (arrhythmias).  The monitor is used to record your heart rhythm when you have heart-related symptoms.  Make sure you understand how to send the information from the monitor to your health care provider.  It is important to press the button on the monitor when you have any heart-related symptoms.  Keep a diary of your activities, such as walking, doing chores, and taking medicine. It is very important to note what you were doing when you pushed the button to record your symptoms. This will help your health care provider learn what might be causing your symptoms. This information is not intended to replace advice given to you by your health care provider. Make sure you discuss any questions you have with your health care provider. Document Released: 07/12/2008 Document Revised: 09/17/2016 Document Reviewed: 09/17/2016 Elsevier Interactive Patient Education  2017 [employer].   If you need a refill on your cardiac medications before your next appointment, please call your pharmacy.      Signed, Cline Crock, PA-C  12/14/2016 10:21 AM    [facility] [address] [place], [place], [place]  [address] Phone: [phone]; Fax: [phone]

## 2016-12-14 ENCOUNTER — Ambulatory Visit
Admission: RE | Admit: 2016-12-14 | Discharge: 2016-12-14 | Disposition: A | Discharge status: Home / Self Care | Payer: Medicaid Other | Source: Ambulatory Visit | Attending: Family Medicine | Admitting: Family Medicine

## 2016-12-14 ENCOUNTER — Encounter (INDEPENDENT_AMBULATORY_CARE_PROVIDER_SITE_OTHER): Payer: Self-pay

## 2016-12-14 ENCOUNTER — Ambulatory Visit (INDEPENDENT_AMBULATORY_CARE_PROVIDER_SITE_OTHER): Payer: Medicaid Other | Admitting: Physician Assistant

## 2016-12-14 ENCOUNTER — Encounter: Payer: Self-pay | Admitting: Physician Assistant

## 2016-12-14 VITALS — BP 160/62 | HR 78 | Ht 60.0 in | Wt 321.0 lb

## 2016-12-14 DIAGNOSIS — I10 Essential (primary) hypertension: Secondary | ICD-10-CM | POA: Diagnosis not present

## 2016-12-14 DIAGNOSIS — Z8249 Family history of ischemic heart disease and other diseases of the circulatory system: Secondary | ICD-10-CM | POA: Diagnosis not present

## 2016-12-14 DIAGNOSIS — G4733 Obstructive sleep apnea (adult) (pediatric): Secondary | ICD-10-CM | POA: Diagnosis not present

## 2016-12-14 DIAGNOSIS — R079 Chest pain, unspecified: Secondary | ICD-10-CM | POA: Diagnosis not present

## 2016-12-14 DIAGNOSIS — R002 Palpitations: Secondary | ICD-10-CM

## 2016-12-14 DIAGNOSIS — R0789 Other chest pain: Secondary | ICD-10-CM

## 2016-12-14 MED ORDER — CHLORTHALIDONE 25 MG PO TABS: 25.0000 mg | ORAL_TABLET | Freq: Every day | ORAL | 1 refills | Status: AC

## 2016-12-14 NOTE — Patient Instructions (Addendum)
Medication Instructions:  Your physician has recommended you make the following change in your medication:  1.  START the Chlorthalidone 25 mg taking 1 tablet daily   Labwork: None ordered  Testing/Procedures: Your physician has requested that you have a lexiscan myoview. For further information please visit [email or web address]. Please follow instruction sheet, as given.   Your physician has recommended that you wear an event monitor. Event monitors are medical devices that record the heart's electrical activity. Doctors most often us these monitors to diagnose arrhythmias. Arrhythmias are problems with the speed or rhythm of the heartbeat. The monitor is a small, portable device. You can wear one while you do your normal daily activities. This is usually used to diagnose what is causing palpitations/syncope (passing out).   Follow-Up: Your physician recommends that you schedule a follow-up appointment in: DEPENDING UPON TEST RESULTS  Any Other Special Instructions Will Be Listed Below (If Applicable). Pharmacologic Stress Electrocardiogram Introduction A pharmacologic stress electrocardiogram is a heart (cardiac) test that uses nuclear imaging to evaluate the blood supply to your heart. This test may also be called a pharmacologic stress electrocardiography. Pharmacologic means that a medicine is used to increase your heart rate and blood pressure. This stress test is done to find areas of poor blood flow to the heart by determining the extent of coronary artery disease (CAD). Some people exercise on a treadmill, which naturally increases the blood flow to the heart. For those people unable to exercise on a treadmill, a medicine is used. This medicine stimulates your heart and will cause your heart to beat harder and more quickly, as if you were exercising. Pharmacologic stress tests can help determine:  The adequacy of blood flow to your heart during increased levels of activity in order  to clear you for discharge home.  The extent of coronary artery blockage caused by CAD.  Your prognosis if you have suffered a heart attack.  The effectiveness of cardiac procedures done, such as an angioplasty, which can increase the circulation in your coronary arteries.  Causes of chest pain or pressure. LET [facility] PROVIDER KNOW ABOUT:  Any allergies you have.  All medicines you are taking, including vitamins, herbs, eye drops, creams, and over-the-counter medicines.  Previous problems you or members of your family have had with the use of anesthetics.  Any blood disorders you have.  Previous surgeries you have had.  Medical conditions you have.  Possibility of pregnancy, if this applies.  If you are currently breastfeeding. RISKS AND COMPLICATIONS Generally, this is a safe procedure. However, as with any procedure, complications can occur. Possible complications include:  You develop pain or pressure in the following areas:  Chest.  Jaw or neck.  Between your shoulder blades.  Radiating down your left arm.  Headache.  Dizziness or light-headedness.  Shortness of breath.  Increased or irregular heartbeat.  Low blood pressure.  Nausea or vomiting.  Flushing.  Redness going up the arm and slight pain during injection of medicine.  Heart attack (rare). BEFORE THE PROCEDURE  Avoid all forms of caffeine for 24 hours before your test or as directed by your health care provider. This includes coffee, tea (even decaffeinated tea), caffeinated sodas, chocolate, cocoa, and certain pain medicines.  Follow your health care provider's instructions regarding eating and drinking before the test.  Take your medicines as directed at regular times with water unless instructed otherwise. Exceptions may include:  If you have diabetes, ask how you are to take your insulin  or pills. It is common to adjust insulin dosing the morning of the test.  If you are  taking beta-blocker medicines, it is important to talk to your health care provider about these medicines well before the date of your test. Taking beta-blocker medicines may interfere with the test. In some cases, these medicines need to be changed or stopped 24 hours or more before the test.  If you wear a nitroglycerin patch, it may need to be removed prior to the test. Ask your health care provider if the patch should be removed before the test.  If you use an inhaler for any breathing condition, bring it with you to the test.  If you are an outpatient, bring a snack so you can eat right after the stress phase of the test.  Do not smoke for 4 hours prior to the test or as directed by your health care provider.  Do not apply lotions, powders, creams, or oils on your chest prior to the test.  Wear comfortable shoes and clothing. Let your health care provider know if you were unable to complete or follow the preparations for your test. PROCEDURE  Multiple patches (electrodes) will be put on your chest. If needed, small areas of your chest may be shaved to get better contact with the electrodes. Once the electrodes are attached to your body, multiple wires will be attached to the electrodes, and your heart rate will be monitored.  An IV access will be started. A nuclear trace (isotope) is given. The isotope may be given intravenously, or it may be swallowed. Nuclear refers to several types of radioactive isotopes, and the nuclear isotope lights up the arteries so that the nuclear images are clear. The isotope is absorbed by your body. This results in low radiation exposure.  A resting nuclear image is taken to show how your heart functions at rest.  A medicine is given through the IV access.  A second scan is done about 1 hour after the medicine injection and determines how your heart functions under stress.  During this stress phase, you will be connected to an electrocardiogram machine.  Your blood pressure and oxygen levels will be monitored. What to expect after the procedure  Your heart rate and blood pressure will be monitored after the test.  You may return to your normal schedule, including diet,activities, and medicines, unless your health care provider tells you otherwise. This information is not intended to replace advice given to you by your health care provider. Make sure you discuss any questions you have with your health care provider. Document Released: 02/19/2009 Document Revised: 03/10/2016 Document Reviewed: 04/11/2016 Elsevier Interactive Patient Education  2017 Elsevier Inc.    Cardiac Event Monitoring A cardiac event monitor is a small recording device that is used to detect abnormal heart rhythms (arrhythmias). The monitor is used to record your heart rhythm when you have symptoms, such as:  Fast heartbeats (palpitations), such as heart racing or fluttering.  Dizziness.  Fainting or light-headedness.  Unexplained weakness. Some monitors are wired to electrodes placed on your chest. Electrodes are flat, sticky disks that attach to your skin. Other monitors may be hand-held or worn on the wrist. The monitor can be worn for up to 30 days. If the monitor is attached to your chest, a technician will prepare your chest for the electrode placement and show you how to work the monitor. Take time to practice using the monitor before you leave the office. Make sure you  understand how to send the information from the monitor to your health care provider. In some cases, you may need to use a landline telephone instead of a cell phone. What are the risks? Generally, this device is safe to use, but it possible that the skin under the electrodes will become irritated. How to use your cardiac event monitor  Wear your monitor at all times, except when you are in water:  Do not let the monitor get wet.  Take the monitor off when you bathe. Do not swim or use a  hot tub with it on.  Keep your skin clean. Do not put body lotion or moisturizer on your chest.  Change the electrodes as told by your health care provider or any time they stop sticking to your skin. You may need to use medical tape to keep them on.  Try to put the electrodes in slightly different places on your chest to help prevent skin irritation. They must remain in the area under your left breast and in the upper right section of your chest.  Make sure the monitor is safely clipped to your clothing or in a location close to your body that your health care provider recommends.  Press the button to record as soon as you feel heart-related symptoms, such as:  Dizziness.  Weakness.  Light-headedness.  Palpitations.  Thumping or pounding in your chest.  Shortness of breath.  Unexplained weakness.  Keep a diary of your activities, such as walking, doing chores, and taking medicine. It is very important to note what you were doing when you pushed the button to record your symptoms. This will help your health care provider determine what might be contributing to your symptoms.  Send the recorded information as recommended by your health care provider. It may take some time for your health care provider to process the results.  Change the batteries as told by your health care provider.  Keep electronic devices away from your monitor. This includes:  Tablets.  MP3 players.  Cell phones.  While wearing your monitor you should avoid:  Electric blankets.  Firefighter.  Electric toothbrushes.  Microwave ovens.  Magnets.  Metal detectors. Get help right away if:  You have chest pain.  You have extreme difficulty breathing or shortness of breath.  You develop a very fast heartbeat that persists.  You develop dizziness that does not go away.  You faint or constantly feel like you are about to faint. Summary  A cardiac event monitor is a small recording  device that is used to help detect abnormal heart rhythms (arrhythmias).  The monitor is used to record your heart rhythm when you have heart-related symptoms.  Make sure you understand how to send the information from the monitor to your health care provider.  It is important to press the button on the monitor when you have any heart-related symptoms.  Keep a diary of your activities, such as walking, doing chores, and taking medicine. It is very important to note what you were doing when you pushed the button to record your symptoms. This will help your health care provider learn what might be causing your symptoms. This information is not intended to replace advice given to you by your health care provider. Make sure you discuss any questions you have with your health care provider. Document Released: 07/12/2008 Document Revised: 09/17/2016 Document Reviewed: 09/17/2016 Elsevier Interactive Patient Education  2017 [employer].   If you need a refill on your  cardiac medications before your next appointment, please call your pharmacy.

## 2016-12-15 ENCOUNTER — Telehealth: Payer: Self-pay | Admitting: *Deleted

## 2016-12-15 DIAGNOSIS — R0789 Other chest pain: Secondary | ICD-10-CM | POA: Insufficient documentation

## 2016-12-15 NOTE — Telephone Encounter (Signed)
Patient was made aware and voiced understanding.  He did mention that he had an abcess in his mouth and that he would like to see a dentist.  Informed patient to go ahead and reach out to a dentist to have this taken care of and if there was something they needed to know from us they would ask.  Rhilee Currin,CMA

## 2016-12-15 NOTE — Telephone Encounter (Signed)
-----   Message from Campbell StallKaty Dodd Mayo, MD sent at 12/15/2016  8:09 AM EST ----- Please let Mr. Rachael Rogers know that his chest x-ray was normal.

## 2016-12-15 NOTE — Assessment & Plan Note (Signed)
Pt presenting with left sided chest pain. She has many atypical features including chest pain worse with cough and chest pain that is reproducible on palpation of the sternum. No associated SOB, nausea, or diaphoresis. No pain with exertion.  - EKG performed in clinic with new ST and T wave changes in the inferior leads compared to previous EKG on 05/2015. - Stat troponin and d-dimer negative - CXR negative for acute abnormalities - Will refer to Cardiology for further testing. Pt may benefit from stress test.

## 2016-12-19 ENCOUNTER — Other Ambulatory Visit: Payer: Self-pay | Admitting: Internal Medicine

## 2016-12-19 ENCOUNTER — Other Ambulatory Visit: Payer: Self-pay | Admitting: Obstetrics and Gynecology

## 2017-01-27 ENCOUNTER — Telehealth: Payer: Self-pay | Admitting: *Deleted

## 2017-01-27 NOTE — Telephone Encounter (Signed)
I spoke with Rachael Rogers,patient stated he will call us back he's still sick.

## 2017-01-30 ENCOUNTER — Other Ambulatory Visit: Payer: Self-pay | Admitting: Family Medicine

## 2017-02-03 ENCOUNTER — Ambulatory Visit (INDEPENDENT_AMBULATORY_CARE_PROVIDER_SITE_OTHER): Payer: Medicaid Other | Admitting: Family Medicine

## 2017-02-03 ENCOUNTER — Encounter: Payer: Self-pay | Admitting: Family Medicine

## 2017-02-03 VITALS — BP 162/100 | HR 82 | Temp 98.8°F | Ht 60.0 in | Wt 325.0 lb

## 2017-02-03 DIAGNOSIS — R0602 Shortness of breath: Secondary | ICD-10-CM | POA: Diagnosis not present

## 2017-02-03 DIAGNOSIS — J069 Acute upper respiratory infection, unspecified: Secondary | ICD-10-CM

## 2017-02-03 MED ORDER — DEXTROMETHORPHAN-GUAIFENESIN 10-200 MG PO CAPS: ORAL_CAPSULE | ORAL | 0 refills | Status: AC

## 2017-02-03 MED ORDER — BENZONATATE 200 MG PO CAPS: 200.0000 mg | ORAL_CAPSULE | Freq: Two times a day (BID) | ORAL | 0 refills | Status: AC | PRN

## 2017-02-03 MED ORDER — AZITHROMYCIN 250 MG PO TABS: ORAL_TABLET | ORAL | 0 refills | Status: AC

## 2017-02-03 NOTE — Progress Notes (Signed)
   Subjective: CC: cold symptoms ZOX:WRUEA Rachael Rogers is Rachael 41 y.o. female who identifies as female, presenting to clinic today for same day appointment. PCP: Mickie Hillier, MD Concerns today include:  1. Cold symptoms Patient reports that he has productive cough for over Rachael week.  He notes that phlegm is yellow and turned to green now.  Reports fever the 2nd or 3rd day to 102F that has resolved.  He has been taking soups.  Reports nasal congestion and colored discharge from nose.  Reports nausea without vomiting.  Reports nonbloody diarrhea.  Reports drinking enough fluids.  Has been taking Motrin  once daily and occ Tylenol.  Using CPAP and albuterol at home.  He reports SOB and wheeze.  Allergies  Allergen Reactions  . 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    . Milk-Related Compounds Anaphylaxis and Hives  . Shellfish Allergy Anaphylaxis and Hives  . Theophyllines Anaphylaxis and Hives   Social Hx reviewed: active smoker. MedHx, current medications and allergies reviewed.  Please see EMR. ROS: Per HPI  Objective: Office vital signs reviewed. BP (!) 162/100   Pulse 82   Temp 98.8 F (37.1 C) (Oral)   Ht 5' (1.524 m)   Wt (!) 325 lb (147.4 kg)   SpO2 98%   BMI 63.47 kg/m   Physical Examination:  General: Awake, alert, obese, No acute distress HEENT: Normal    Neck: No masses palpated. No lymphadenopathy    Ears: Tympanic membranes intact, normal light reflex, no erythema, no bulging    Eyes: PERRLA, EOMI, sclera white    Nose: nasal turbinates moist, scant clear nasal discharge w/ edematous, erythematous nasal turbinates    Throat: moist mucus membranes, mild o/p erythema, no tonsillar exudate.  Airway is patent Cardio: regular rate and rhythm, S1S2 heard, no murmurs appreciated Pulm: global decreased breath sounds 2/2 habitus. otherwise, no wheezes, rhonchi or rales; normal work of breathing on room  air  Assessment/ Plan: 41 y.o. female   1. Acute upper respiratory infection.  No clear evidence of pna on exam.  Given duration of cough will treat with abx and have him get Rachael CXR (esp given that he is an active smoker).  He is afebrile and nontoxic on exam. - Home care instructions reviewed. - benzonatate (TESSALON) 200 MG capsule; Take 1 capsule (200 mg total) by mouth 2 (two) times daily as needed for cough.  Dispense: 20 capsule; Refill: 0 - azithromycin (ZITHROMAX) 250 MG tablet; Take  PO day 1, then take  PO day 2-5.  Dispense: 6 tablet; Refill: 0 - Dextromethorphan-Guaifenesin (CORICIDIN HBP CONGESTION/COUGH) 10-200 MG CAPS; Use as directed on package  Dispense: 1 each; Refill: 0 - DG Chest 2 View; Future - Return precautions discussed  2. Shortness of breath - Continue Albuterol inhaler/ nebs scheduled for next 48 hours then prn - DG Chest 2 View; Future  Additionally, BP noted to be elevated.  He has not taken BP meds today.  No red flags but patient encouraged to follow up with PCP closely for recheck.  Raliegh Ip, DO PGY-3, [facility]

## 2017-02-03 NOTE — Patient Instructions (Signed)
It appears that you have an upper respiratory infection.  I have sent in an antibiotic for you and a cough medication.  - Get plenty of rest and drink plenty of fluids. - Try to breathe moist air. Use a humidifier or take a steamy shower. - Consume warm fluids (soup or tea) to provide relief for a stuffy nose and to loosen phlegm. - For nasal stuffiness, try saline nasal spray or a Neti Pot. - For sore throat pain relief: suck on throat lozenges, hard candy or popsicles; gargle with warm salt water (1/4 tsp. salt per 8 oz. of water); and eat soft, bland foods. - Eat a well-balanced diet. If you cannot, ensure you are getting enough nutrients by taking a daily multivitamin. - Avoid dairy products, as they can thicken phlegm. - Avoid alcohol, as it impairs your body's immune system.  CONTACT YOUR DOCTOR IF YOU EXPERIENCE ANY OF THE FOLLOWING: - High fever - Ear pain - Sinus-type headache - Unusually severe cold symptoms - Cough that gets worse while other cold symptoms improve - Flare up of any chronic lung problem, such as asthma - Your symptoms persist longer than 2 weeks

## 2017-02-07 ENCOUNTER — Telehealth: Payer: Self-pay | Admitting: *Deleted

## 2017-02-07 NOTE — Telephone Encounter (Signed)
Pharmacy unable to fill benzonatate unless patient would like to pay out of pocket as medicaid does not cover any medications for cough. FYI to prescribing MD.

## 2017-03-01 ENCOUNTER — Encounter: Payer: Self-pay | Admitting: *Deleted

## 2017-03-27 ENCOUNTER — Telehealth: Payer: Self-pay | Admitting: *Deleted

## 2017-03-27 NOTE — Telephone Encounter (Signed)
03/27/17  Unable to reach this patient to schedule her monitor,ok per Cline CrockKathryn Thompson to remove monitor order. 12/27/2016 Called spoke with pt wife she advised that patient is going to have teeth pulled and will call us back to schedule once the teeth where pulled per wife the recommendation of the pcp. stpegram  12/14/2016 pt ride was waiting states they will call back to schedule appts. stpegram  01/27/17 PATIENT SAID HE WILL CALL US BACK TO SCHEDULE,STILL SICK/D.Braison Snoke  03/01/17 LETTER MAILED

## 2017-04-15 ENCOUNTER — Emergency Department (HOSPITAL_COMMUNITY)
Admission: EM | Admit: 2017-04-15 | Discharge: 2017-04-16 | Disposition: E | Payer: Medicaid Other | Attending: Emergency Medicine | Admitting: Emergency Medicine

## 2017-04-15 DIAGNOSIS — J45909 Unspecified asthma, uncomplicated: Secondary | ICD-10-CM | POA: Insufficient documentation

## 2017-04-15 DIAGNOSIS — I469 Cardiac arrest, cause unspecified: Secondary | ICD-10-CM | POA: Diagnosis not present

## 2017-04-15 MED ORDER — EPINEPHRINE PF 1 MG/10ML IJ SOSY
PREFILLED_SYRINGE | INTRAMUSCULAR | Status: AC | PRN
  Administered 2017-04-15: 1 mg via INTRAVENOUS

## 2017-04-16 NOTE — ED Triage Notes (Addendum)
Patient arrived with CPR in progress. EMS was called by significant other when found unresponsive in bed, gurgling respirations. Was in Fib on their arrival and shocked x 1. Arrived with IO established and had received EPI x 9. King airway on arrival, hx of asthma. Received 800 NS prior to arrival CPR started in field at [phone]

## 2017-04-16 NOTE — Code Documentation (Signed)
U/S shows no cardiac activity, CPR ongoing

## 2017-04-16 NOTE — Code Documentation (Signed)
CBG 143 pta

## 2017-04-16 NOTE — ED Notes (Signed)
10/23/13 

## 2017-04-16 NOTE — ED Notes (Signed)
Contacted ME/Amy for Dr. Arnoldo MoraleMumma

## 2017-04-16 NOTE — ED Provider Notes (Signed)
[facility]-EMERGENCY DEPT Provider Note   CSN: [id number] Arrival date & time: 04-09-2017  1706     History   Chief Complaint No chief complaint on file.   HPI Phylliss BobMaria A Huettner is a 41 y.o. female.  HPI  Patient is a 41 year old with a past medical history significant for asthma, who presents to the emergency department following a cardiac arrest. Per report patient had talked to his wife and then laid down to take a nap. When his wife went into the room he was not breathing, had blood coming from his mouth with gurgling respirations. Patient was not responsive at that time. When EMS arrived patient was unresponsive but with a pulse. Patient then lost pulses with EMS will still at his house. Initial rhythm V. fib, defibrillated. Received epi x 9, IVF and had a King airway placed prior to arrival. CPR for 15 minutes prior to arrival. Rhythm of PEA or asystole.  No past medical history on file.  There are no active problems to display for this patient.   No past surgical history on file.     Home Medications    Prior to Admission medications   Not on File    Family History No family history on file.  Social History Social History  Substance Use Topics  . Smoking status: Not on file  . Smokeless tobacco: Not on file  . Alcohol use Not on file     Allergies   Patient has no allergy information on record.   Review of Systems Review of Systems  Unable to perform ROS: Intubated     Physical Exam Updated Vital Signs Wt 136.1 kg (300 lb)   Physical Exam  Constitutional: He appears well-developed and well-nourished.  Obese  HENT:  Head: Atraumatic.  Eyes:  3 mm equal and nonreactive  Neck: No tracheal deviation present.  Pulmonary/Chest:  King airway in place. Blood coming from the mouth and ET tube  Abdominal: Soft. He exhibits no distension.  Musculoskeletal:  No unilateral leg swelling  Neurological:  GCS 3T  Skin: Skin is warm.     ED Treatments / Results    Labs (all labs ordered are listed, but only abnormal results are displayed) Labs Reviewed - No data to display  EKG  EKG Interpretation None       Radiology No results found.  Procedures Procedures (including critical care time)   EMERGENCY DEPARTMENT US CARDIAC EXAM "Study: Limited Ultrasound of the Heart and Pericardium"  INDICATIONS:Cardiac arrest Multiple views of the heart and pericardium were obtained in real-time with a multi-frequency probe.  PERFORMED NW:GNFAOZBY:Myself IMAGES ARCHIVED?: Yes LIMITATIONS:  Emergent procedure VIEWS USED: Subcostal 4 chamber and Parasternal long axis INTERPRETATION: Cardiac activity absent, Pericardial effusioin absent, Cardiac tamponade absent and No cardiac activity    Medications Ordered in ED Medications  EPINEPHrine (ADRENALIN) 1 MG/10ML injection (1 mg Intravenous Given 04-09-2017 1711)     Initial Impression / Assessment and Plan / ED Course  I have reviewed the triage vital signs and the nursing notes.  Pertinent labs & imaging results that were available during my care of the patient were reviewed by me and considered in my medical decision making (see chart for details).     41 year old female who presents to the emergency department with cardiac arrest. Initial rhythm V. fib, defibrillated. CPR for 50 minutes prior to arrival given epinephrine x 9.   On arrival CPR continued given epinephrine. On rhythm check patient was in asystole. Bedside cardiac ultrasound  showed no cardiac activity, no pericardial effusion. CPR was continued for 10 minutes. Time of death 17:15.   After discussing with the patient's family members patient is a female currently transitioning to, female. History of asthma and is currently on testosterone injections followed by family medicine. Discussed the patient's case with the medical examiner, Dr. Audree Camel. Patient most likely had a pulmonary embolism given the sudden onset and history of testosterone  injections. Dr. Annia Friendly was provided next of kin information to see if he would be a medical examiner case.  Patient's family at bedside, updated on the patient's death.  Final Clinical Impressions(s) / ED Diagnoses   Final diagnoses:  Cardiac arrest [facility])    New Prescriptions There are no discharge medications for this patient.    Corena Herter, MD 05/01/2017 Corky Crafts    Blane Ohara, MD 04/18/17 [phone]

## 2017-04-16 DEATH — deceased

## 2017-04-21 MED FILL — Medication: Qty: 1 | Status: AC

## 2017-04-24 ENCOUNTER — Ambulatory Visit (HOSPITAL_COMMUNITY): Payer: Medicaid Other

## 2017-04-25 ENCOUNTER — Ambulatory Visit (HOSPITAL_COMMUNITY): Payer: Medicaid Other

## 2018-03-01 IMAGING — DX DG CHEST 2V
2 series · 2 of 2 positions shown · non-contrast
Comparison: 08/23/2014

CLINICAL DATA: Cough, chest pain, wheezing for 2 weeks.

EXAM:
CHEST  2 VIEW

[chest pa]
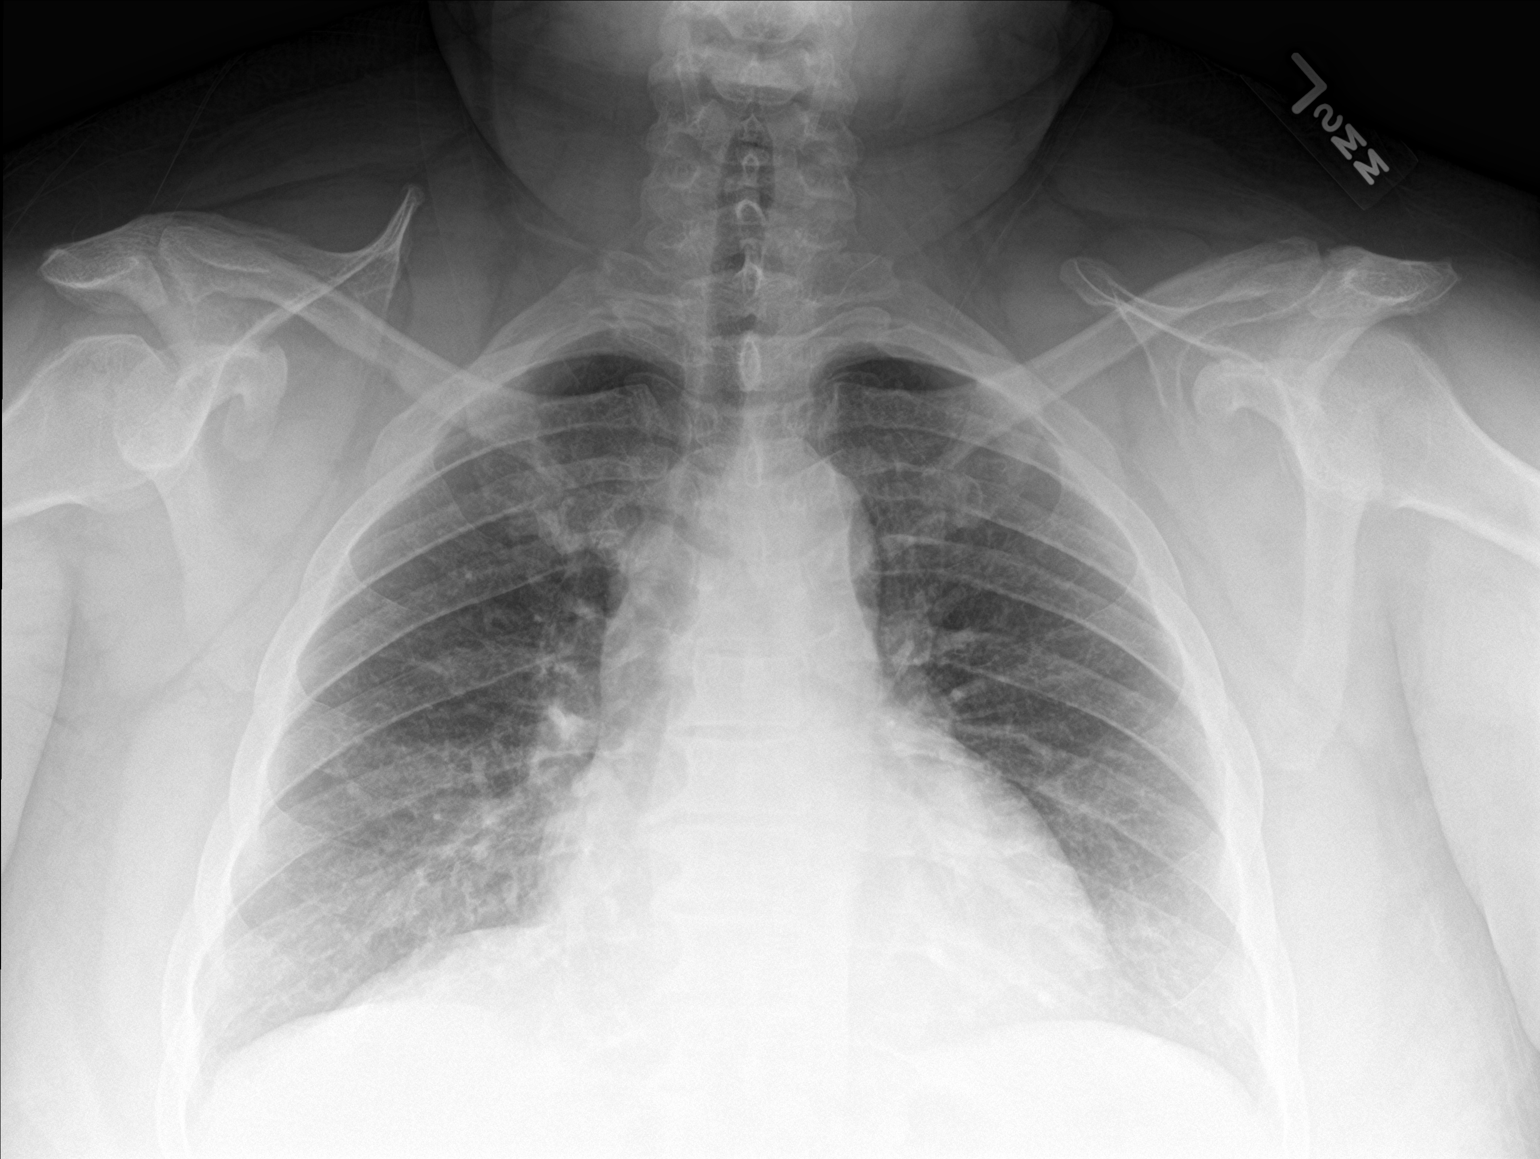

[chest lat]
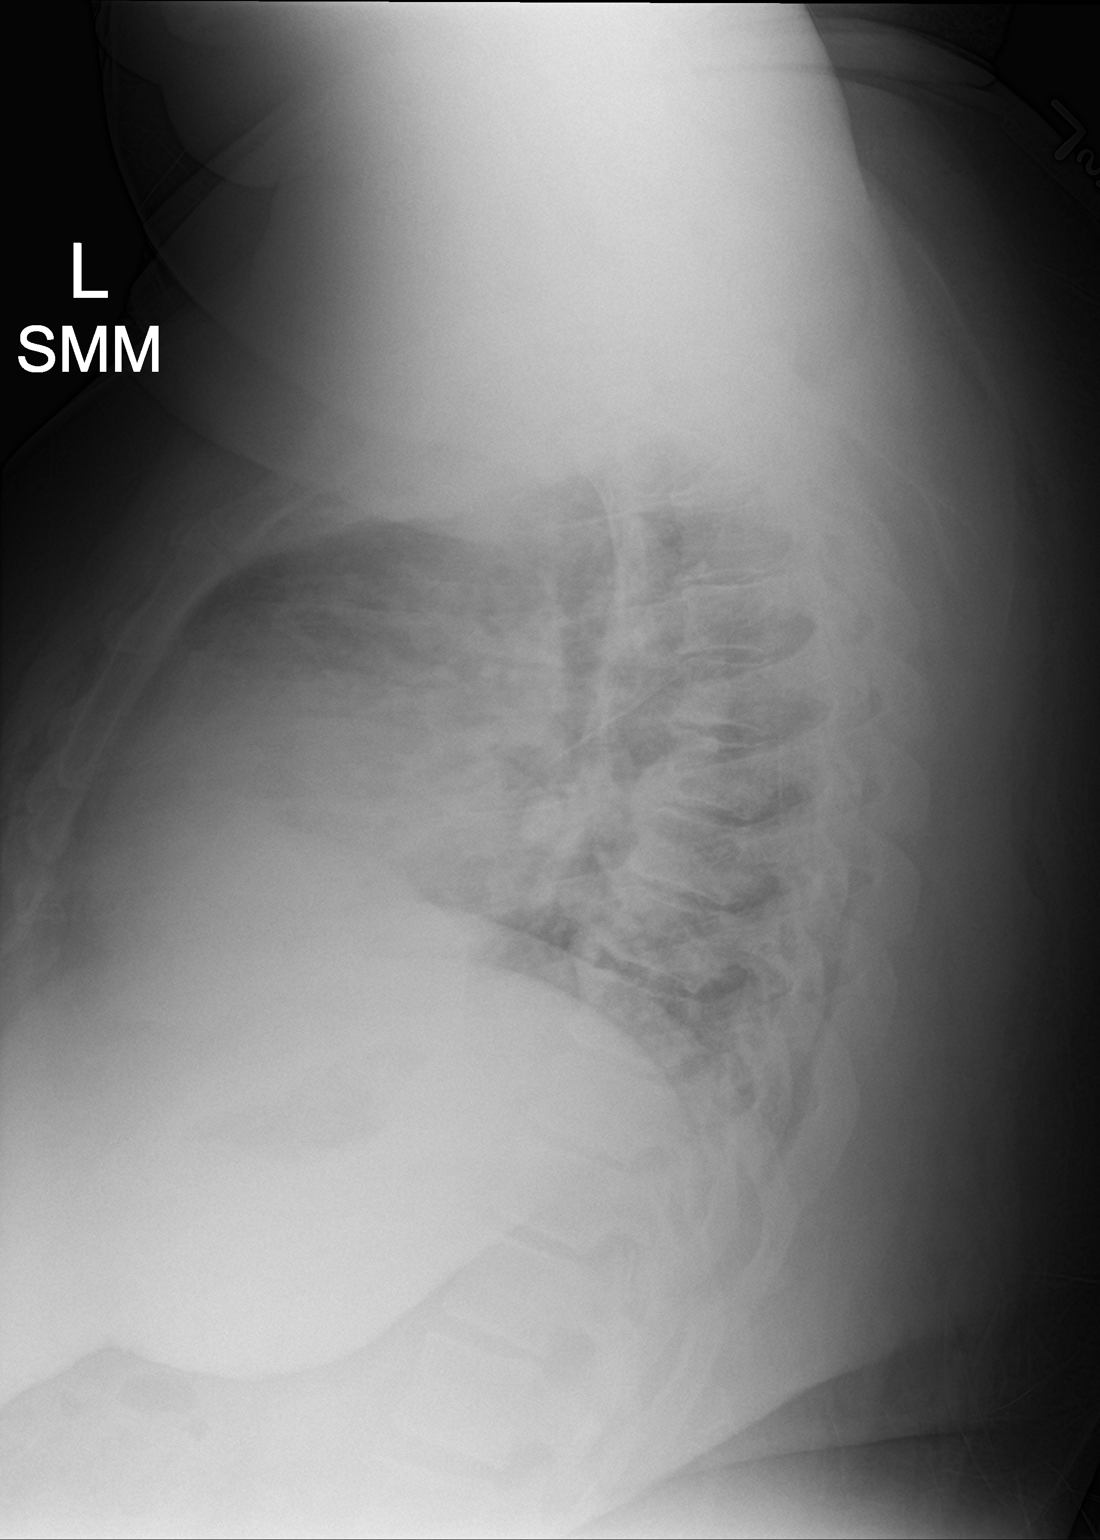

[2 of 2 positions shown; findings below may reference images not displayed]

FINDINGS: Heart is upper limits normal in size. No confluent airspace
opacities. No effusions. No acute bony abnormality.
IMPRESSION: No active cardiopulmonary disease.

## 2018-05-02 IMAGING — CR DG CHEST 2V
2 series · 2 of 2 positions shown · non-contrast
Comparison: Chest x-ray of 10/21/2016

CLINICAL DATA: Left-sided chest pain over the last 3-4 days

EXAM:
CHEST  2 VIEW

[w chest pa]
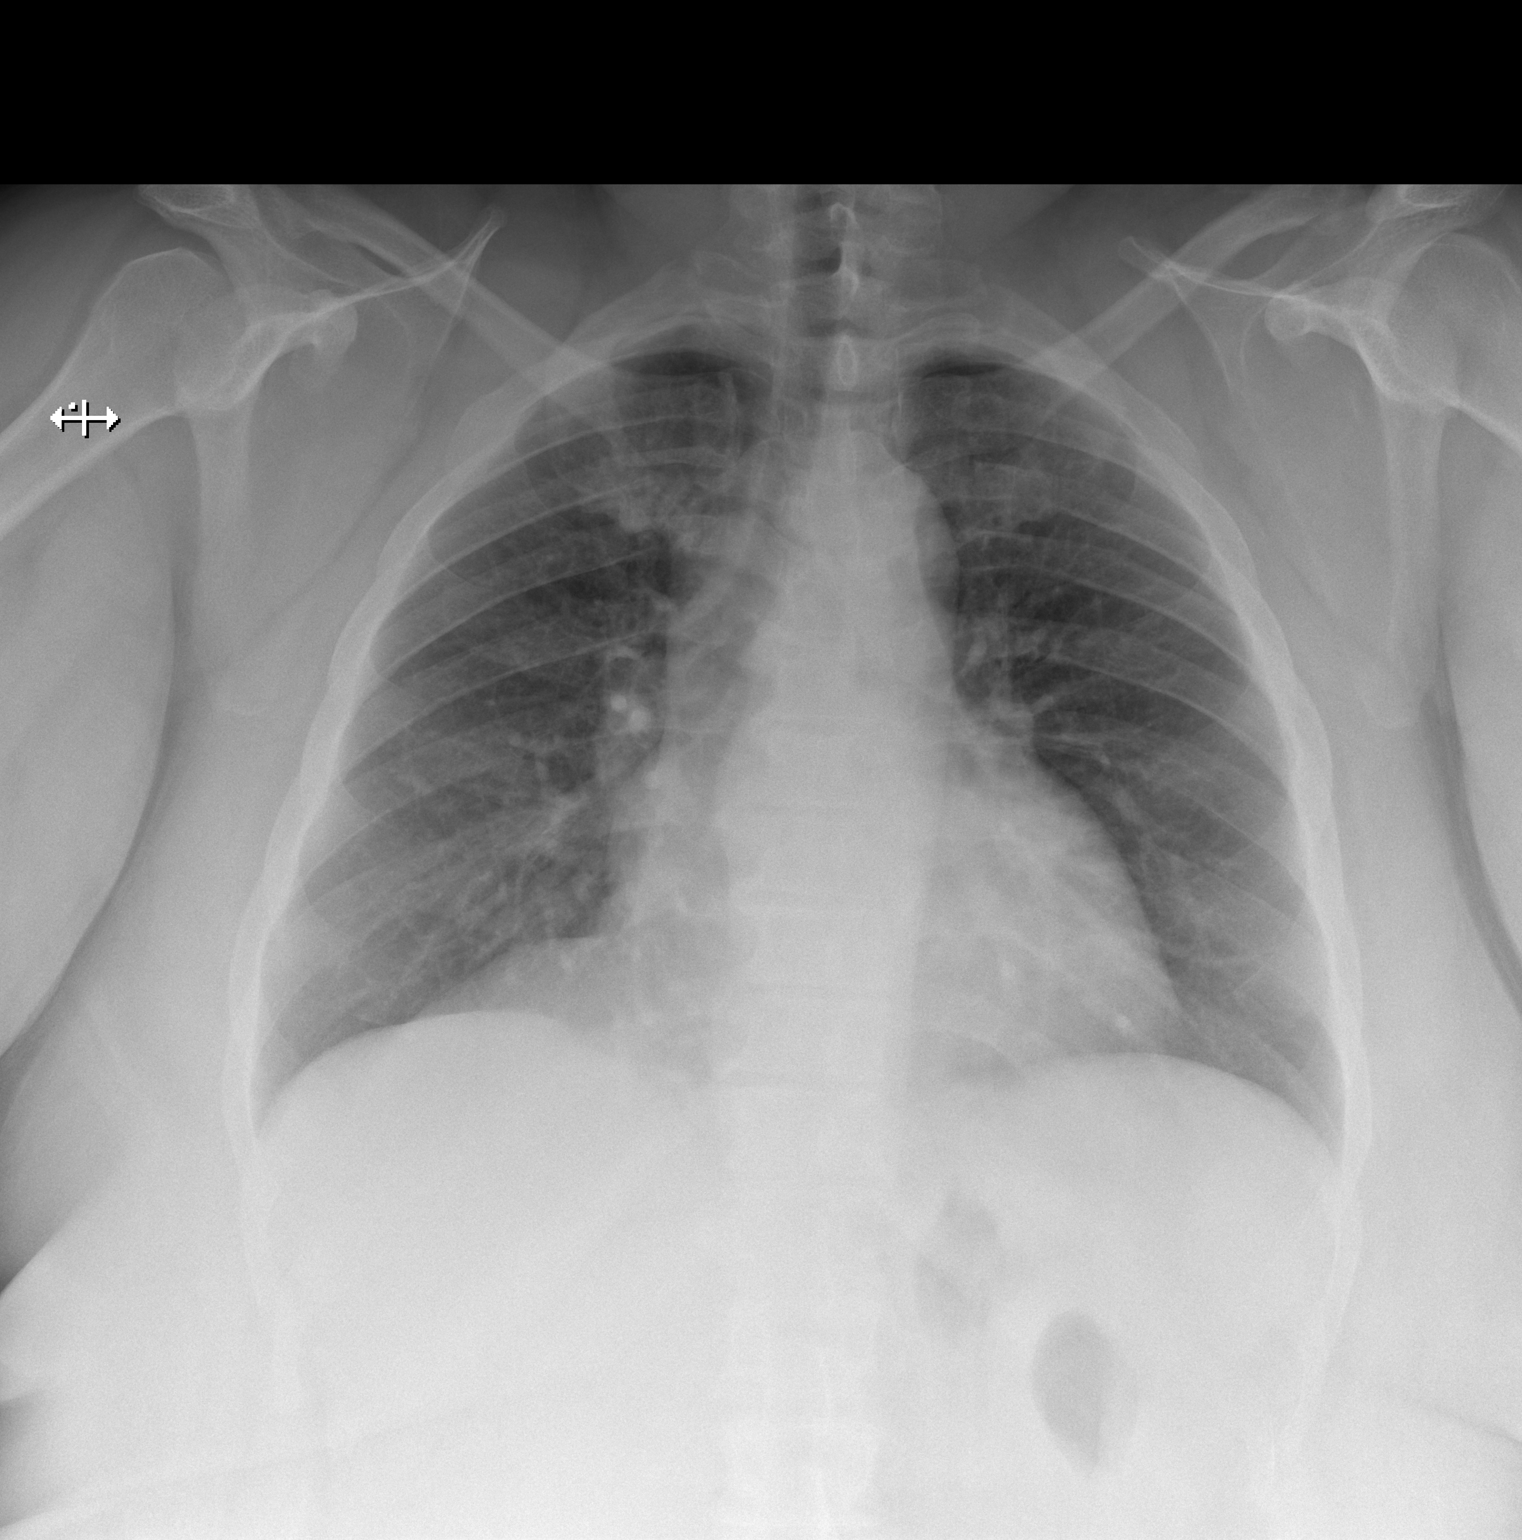

[w chest lat]
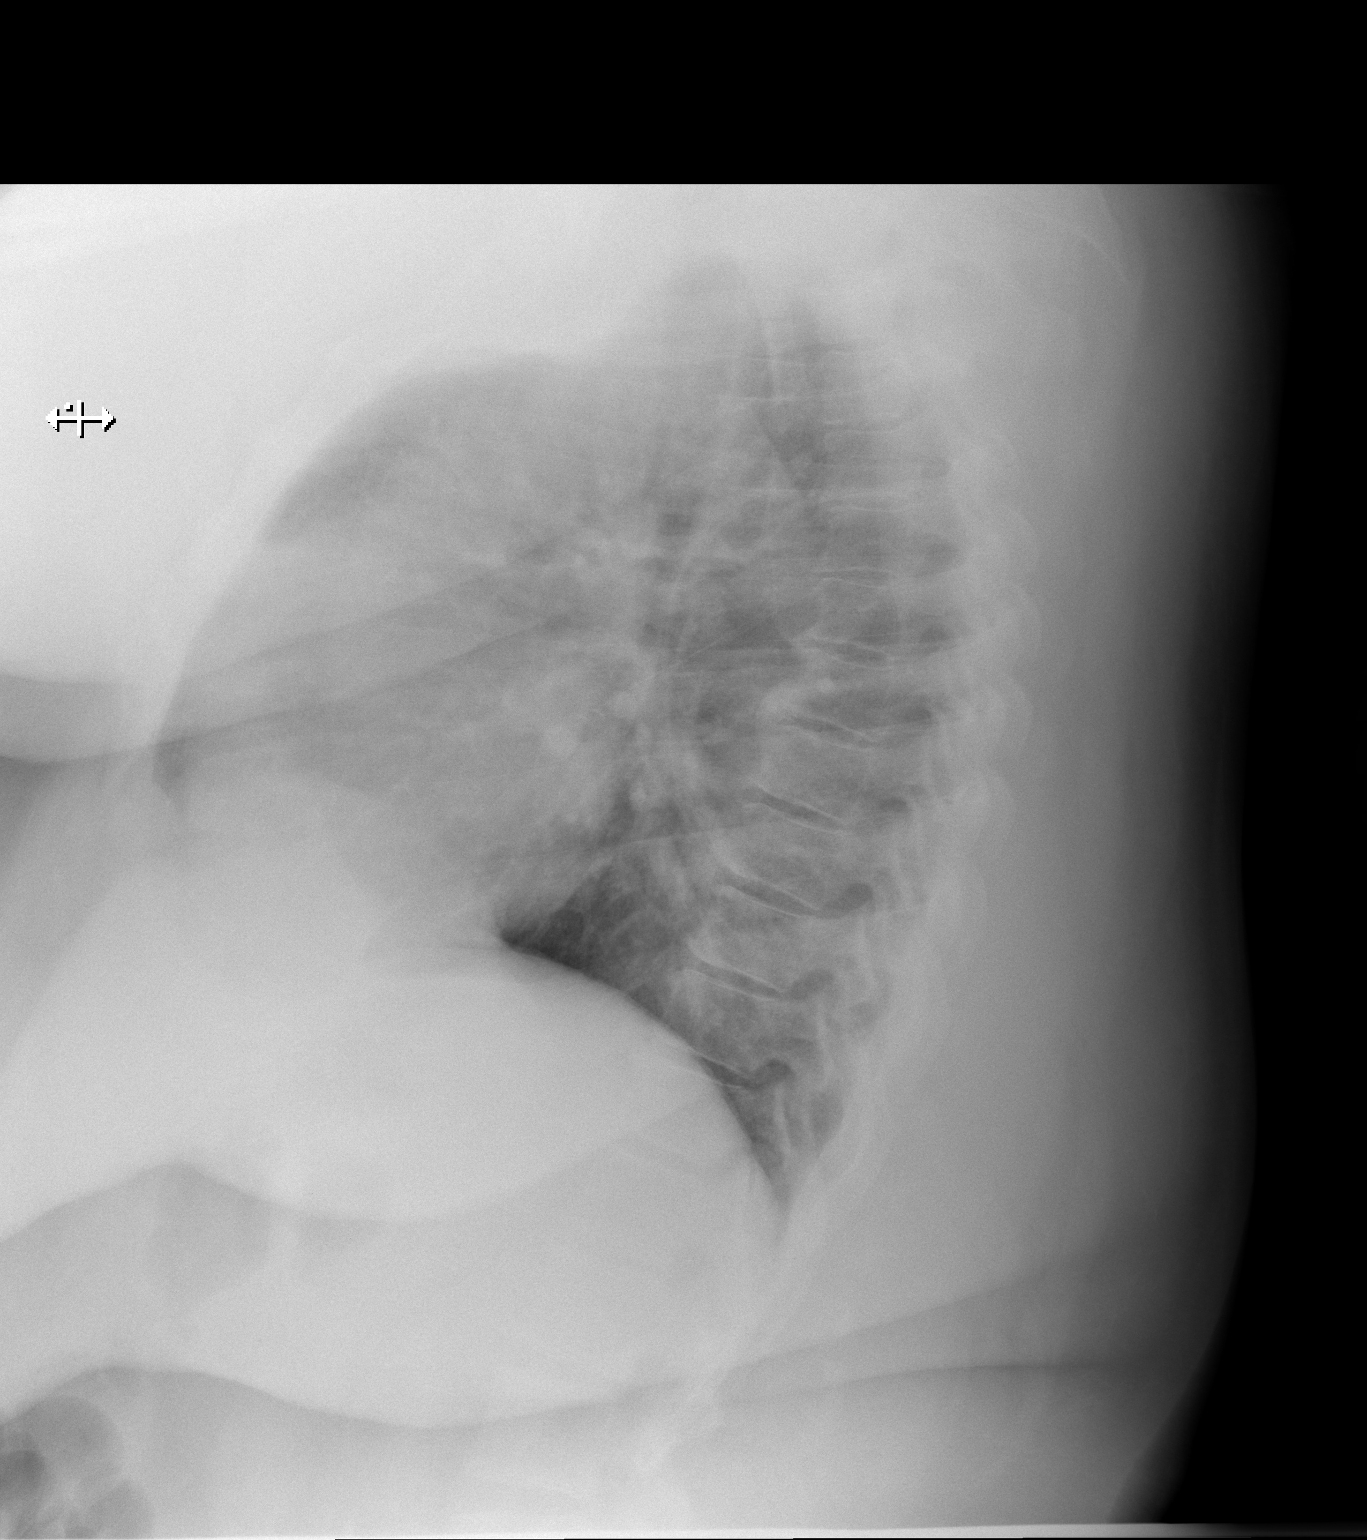

[2 of 2 positions shown; findings below may reference images not displayed]

FINDINGS: No active infiltrate or effusion is seen. Mediastinal and hilar
contours are unremarkable. The heart is borderline enlarged. There
are degenerative changes in the mid to lower thoracic spine.
IMPRESSION: Borderline cardiomegaly.  No active lung disease.
